# Patient Record
Sex: Female | Born: 1990 | Race: Black or African American | Hispanic: No | Marital: Single | Smoking: Never smoker
Health system: Southern US, Community
[De-identification: ages and names within clinical notes are randomized; demographics above are authoritative.]

## PROBLEM LIST (undated history)

## (undated) ENCOUNTER — Inpatient Hospital Stay (HOSPITAL_COMMUNITY): Payer: Self-pay

## (undated) DIAGNOSIS — Z789 Other specified health status: Secondary | ICD-10-CM

## (undated) HISTORY — PX: NO PAST SURGERIES: SHX2092

---
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
## 2014-04-17 ENCOUNTER — Emergency Department (HOSPITAL_COMMUNITY)
Admission: EM | Admit: 2014-04-17 | Discharge: 2014-04-17 | Disposition: A | Discharge status: Home / Self Care | Payer: Managed Care, Other (non HMO) | Attending: Emergency Medicine | Admitting: Emergency Medicine

## 2014-04-17 ENCOUNTER — Emergency Department (HOSPITAL_COMMUNITY): Payer: Managed Care, Other (non HMO)

## 2014-04-17 ENCOUNTER — Encounter (HOSPITAL_COMMUNITY): Payer: Self-pay | Admitting: Emergency Medicine

## 2014-04-17 DIAGNOSIS — Y9389 Activity, other specified: Secondary | ICD-10-CM | POA: Insufficient documentation

## 2014-04-17 DIAGNOSIS — Z3202 Encounter for pregnancy test, result negative: Secondary | ICD-10-CM | POA: Diagnosis not present

## 2014-04-17 DIAGNOSIS — S79912A Unspecified injury of left hip, initial encounter: Secondary | ICD-10-CM | POA: Diagnosis present

## 2014-04-17 DIAGNOSIS — Y92410 Unspecified street and highway as the place of occurrence of the external cause: Secondary | ICD-10-CM | POA: Diagnosis not present

## 2014-04-17 DIAGNOSIS — S70212A Abrasion, left hip, initial encounter: Secondary | ICD-10-CM | POA: Insufficient documentation

## 2014-04-17 LAB — COMPREHENSIVE METABOLIC PANEL
ALK PHOS: 34 U/L — AB (ref 39–117)
ALT: 10 U/L (ref 0–35)
AST: 18 U/L (ref 0–37)
Albumin: 4 g/dL (ref 3.5–5.2)
Anion gap: 10 (ref 5–15)
BILIRUBIN TOTAL: 0.6 mg/dL (ref 0.3–1.2)
BUN: 11 mg/dL (ref 6–23)
CALCIUM: 9.1 mg/dL (ref 8.4–10.5)
CHLORIDE: 105 meq/L (ref 96–112)
CO2: 25 meq/L (ref 19–32)
Creatinine, Ser: 0.79 mg/dL (ref 0.50–1.10)
Glucose, Bld: 85 mg/dL (ref 70–99)
Potassium: 3.4 mEq/L — ABNORMAL LOW (ref 3.7–5.3)
Sodium: 140 mEq/L (ref 137–147)
TOTAL PROTEIN: 7.5 g/dL (ref 6.0–8.3)

## 2014-04-17 LAB — CBC WITH DIFFERENTIAL/PLATELET
BASOS ABS: 0 10*3/uL (ref 0.0–0.1)
BASOS PCT: 1 % (ref 0–1)
Eosinophils Absolute: 0.1 10*3/uL (ref 0.0–0.7)
Eosinophils Relative: 3 % (ref 0–5)
HCT: 33.7 % — ABNORMAL LOW (ref 36.0–46.0)
Hemoglobin: 10.6 g/dL — ABNORMAL LOW (ref 12.0–15.0)
LYMPHS ABS: 1.2 10*3/uL (ref 0.7–4.0)
LYMPHS PCT: 27 % (ref 12–46)
MCH: 25.4 pg — AB (ref 26.0–34.0)
MCHC: 31.5 g/dL (ref 30.0–36.0)
MCV: 80.6 fL (ref 78.0–100.0)
MONO ABS: 0.6 10*3/uL (ref 0.1–1.0)
MONOS PCT: 13 % — AB (ref 3–12)
Neutro Abs: 2.5 10*3/uL (ref 1.7–7.7)
Neutrophils Relative %: 56 % (ref 43–77)
Platelets: 305 10*3/uL (ref 150–400)
RBC: 4.18 MIL/uL (ref 3.87–5.11)
RDW: 16.5 % — ABNORMAL HIGH (ref 11.5–15.5)
WBC: 4.4 10*3/uL (ref 4.0–10.5)

## 2014-04-17 LAB — PREGNANCY, URINE: Preg Test, Ur: NEGATIVE

## 2014-04-17 LAB — URINE MICROSCOPIC-ADD ON

## 2014-04-17 LAB — URINALYSIS, ROUTINE W REFLEX MICROSCOPIC
Bilirubin Urine: NEGATIVE
Glucose, UA: NEGATIVE mg/dL
Hgb urine dipstick: NEGATIVE
KETONES UR: 15 mg/dL — AB
NITRITE: NEGATIVE
PROTEIN: NEGATIVE mg/dL
Specific Gravity, Urine: 1.026 (ref 1.005–1.030)
UROBILINOGEN UA: 1 mg/dL (ref 0.0–1.0)
pH: 8 (ref 5.0–8.0)

## 2014-04-17 MED ORDER — IBUPROFEN 400 MG PO TABS
400.0000 mg | ORAL_TABLET | Freq: Once | ORAL | Status: AC
  Administered 2014-04-17: 400 mg via ORAL
  Filled 2014-04-17: qty 1

## 2014-04-17 MED ORDER — IBUPROFEN 400 MG PO TABS: 400.0000 mg | ORAL_TABLET | Freq: Four times a day (QID) | ORAL | Status: DC | PRN

## 2014-04-17 MED ORDER — ACETAMINOPHEN 325 MG PO TABS
325.0000 mg | ORAL_TABLET | Freq: Once | ORAL | Status: AC
  Administered 2014-04-17: 325 mg via ORAL
  Filled 2014-04-17: qty 1

## 2014-04-17 MED ORDER — GUAIFENESIN 100 MG/5ML PO SYRP
200.0000 mg | ORAL_SOLUTION | Freq: Once | ORAL | Status: DC
  Filled 2014-04-17: qty 10
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
## 2014-04-17 NOTE — ED Notes (Signed)
Back to room/stretcher, no changes.

## 2014-04-17 NOTE — ED Notes (Signed)
No changes, steady gait[facility] sent[facility]

## 2014-04-17 NOTE — ED Notes (Addendum)
Pt remains in CT

## 2014-04-17 NOTE — ED Notes (Signed)
Patient transported to X-ray 

## 2014-04-17 NOTE — ED Notes (Signed)
Pt talking clearly to mom, texting and taking selfie photos with her cell phone.

## 2014-04-17 NOTE — ED Notes (Addendum)
Pt alert, NAD, calm, interactive, resps e/u, speaking in clear complete sentences, VSS, up to b./r, steady gait.
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
## 2014-04-17 NOTE — ED Notes (Signed)
VS reviewed. Pt not in room.

## 2014-04-17 NOTE — ED Notes (Signed)
Mom at beside. Mom given emotional support.

## 2014-04-17 NOTE — Discharge Instructions (Signed)

## 2014-04-17 NOTE — ED Notes (Addendum)
Back from xray. Pt using cell phone & speaking with registration.  States, "can't give urine sample b/c I just used b/r in radiology", mother at [facility]. Transporter here to take pt to CT. Pt alert, soft spoken, needing to repeat self to be understood, NAD, calm, interactive, resps e/u, no dyspnea noted. PBT contacted about blood draw.

## 2014-04-17 NOTE — ED Notes (Signed)
Out with mother, steady gait.
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
## 2014-06-09 ENCOUNTER — Emergency Department (HOSPITAL_COMMUNITY)
Admission: EM | Admit: 2014-06-09 | Discharge: 2014-06-09 | Disposition: A | Discharge status: Home / Self Care | Payer: Managed Care, Other (non HMO) | Attending: Emergency Medicine | Admitting: Emergency Medicine

## 2014-06-09 ENCOUNTER — Encounter (HOSPITAL_COMMUNITY): Payer: Self-pay | Admitting: Emergency Medicine

## 2014-06-09 ENCOUNTER — Emergency Department (HOSPITAL_COMMUNITY): Payer: Managed Care, Other (non HMO)

## 2014-06-09 DIAGNOSIS — R630 Anorexia: Secondary | ICD-10-CM | POA: Insufficient documentation

## 2014-06-09 DIAGNOSIS — R0982 Postnasal drip: Secondary | ICD-10-CM | POA: Insufficient documentation

## 2014-06-09 DIAGNOSIS — R109 Unspecified abdominal pain: Secondary | ICD-10-CM | POA: Insufficient documentation

## 2014-06-09 DIAGNOSIS — J069 Acute upper respiratory infection, unspecified: Secondary | ICD-10-CM | POA: Diagnosis not present

## 2014-06-09 DIAGNOSIS — R52 Pain, unspecified: Secondary | ICD-10-CM | POA: Diagnosis present

## 2014-06-09 MED ORDER — IBUPROFEN 600 MG PO TABS: 600.0000 mg | ORAL_TABLET | Freq: Four times a day (QID) | ORAL | Status: DC | PRN

## 2014-06-09 MED ORDER — BENZONATATE 100 MG PO CAPS: 200.0000 mg | ORAL_CAPSULE | Freq: Two times a day (BID) | ORAL | Status: DC | PRN

## 2014-06-09 MED ORDER — BENZONATATE 100 MG PO CAPS
100.0000 mg | ORAL_CAPSULE | Freq: Once | ORAL | Status: AC
  Administered 2014-06-09: 100 mg via ORAL
  Filled 2014-06-09: qty 1

## 2014-06-09 MED ORDER — IBUPROFEN 200 MG PO TABS
600.0000 mg | ORAL_TABLET | Freq: Once | ORAL | Status: AC
  Administered 2014-06-09: 600 mg via ORAL
  Filled 2014-06-09: qty 3

## 2014-06-09 NOTE — ED Provider Notes (Signed)
CSN: [id number]     Arrival date & time 06/09/14  0407 History   First MD Initiated Contact with Patient 06/09/14 [id number]     Chief Complaint  Patient presents with  . Abdominal Cramping  . Generalized Body Aches  . URI   Becky Strickland is a 23 y.o. female who presents to the emergency department complaining of cough, sore throat, body aches and sneezing for one week. Patient reports her symptoms started with a sore throat and a nonproductive cough. She reports her cough is now slightly productive of green sputum. She reports her head throbs and when she coughs. She reports she has abdominal cramping only when she coughs. She reports associated sneezing, postnasal drip and chills. She denies fevers, nausea, vomiting, diarrhea, hematemesis, chest pain, shortness of breath, wheezing. The patient denies history of pneumonia or asthma. The patient tookTylenol 2 days ago with mild relief. She has attempted no treatment today.  (Consider location/radiation/quality/duration/timing/severity/associated sxs/prior Treatment) HPI  History reviewed. No pertinent past medical history. History reviewed. No pertinent past surgical history. No family history on file. History  Substance Use Topics  . Smoking status: Never Smoker   . Smokeless tobacco: Not on file  . Alcohol Use: No   OB History    No data available     Review of Systems  Constitutional: Positive for chills and appetite change. Negative for fever and activity change.  HENT: Positive for congestion, postnasal drip, rhinorrhea, sneezing and sore throat. Negative for ear discharge, ear pain, facial swelling, hearing loss, mouth sores, nosebleeds, trouble swallowing and voice change.   Eyes: Negative for pain, redness, itching and visual disturbance.  Respiratory: Positive for cough. Negative for chest tightness, shortness of breath and wheezing.   Cardiovascular: Negative for chest pain and palpitations.  Gastrointestinal: Positive for  abdominal pain. Negative for nausea, vomiting, diarrhea and blood in stool.  Genitourinary: Negative for dysuria, frequency, hematuria, vaginal bleeding, difficulty urinating and pelvic pain.  Musculoskeletal: Negative for back pain, neck pain and neck stiffness.  Skin: Negative for pallor, rash and wound.  Neurological: Negative for dizziness, syncope, weakness, light-headedness and headaches.  All other systems reviewed and are negative.     Allergies  Review of patient's allergies indicates no known allergies.  Home Medications   Prior to Admission medications   Medication Sig Start Date End Date Taking? Authorizing Provider  DM-Phenylephrine-Acetaminophen (TYLENOL COLD MULTI-SYMPTOM) 10-5-325 MG/15ML LIQD Take 15 mLs by mouth 2 (two) times daily as needed.    Yes Historical Provider, MD  guaifenesin (HUMIBID E) 400 MG TABS tablet Take 400 mg by mouth every 4 (four) hours as needed. For cough   Yes Historical Provider, MD  benzonatate (TESSALON) 100 MG capsule Take 2 capsules (200 mg total) by mouth 2 (two) times daily as needed for cough. 06/09/14   Einar GipWilliam Duncan Candra Wegner, PA-C  ibuprofen (ADVIL,MOTRIN) 600 MG tablet Take 1 tablet (600 mg total) by mouth every 6 (six) hours as needed. 06/09/14   Einar GipWilliam Duncan Koree Staheli, PA-C   BP 107/76 mmHg  Pulse 85  Temp(Src) 97.8 F (36.6 C) (Oral)  Resp 18  Ht 5\' 1"  (1.549 m)  Wt 107 lb (48.535 kg)  BMI 20.23 kg/m2  SpO2 100%  LMP 06/05/2014 Physical Exam  Constitutional: She appears well-developed and well-nourished. No distress.  Patient is non-toxic appearing.   HENT:  Head: Normocephalic and atraumatic.  Right Ear: External ear normal.  Left Ear: External ear normal.  Nose: Nose normal.  Mouth/Throat: Oropharynx is  clear and moist. No oropharyngeal exudate.  Bilateral tympanic membranes are pearly-gray without erythema or loss of landmarks. No tonsillar hypertrophy or tonsillar exudate. No frontal, ethmoid or maxillary sinus  tenderness.  Eyes: Conjunctivae are normal. Pupils are equal, round, and reactive to light. Right eye exhibits no discharge. Left eye exhibits no discharge.  Neck: Normal range of motion. Neck supple. No tracheal deviation present.  Cardiovascular: Normal rate, regular rhythm, normal heart sounds and intact distal pulses.  Exam reveals no gallop and no friction rub.   No murmur heard. Pulmonary/Chest: Effort normal and breath sounds normal. No respiratory distress. She has no wheezes. She has no rales.  Abdominal: Soft. Bowel sounds are normal. She exhibits no distension and no mass. There is no tenderness. There is no rebound and no guarding.  Patient's abdomen is soft. Patient's abdomen is nontender. Negative psoas and obturator sign. No rebound tenderness. Negative Murphy sign. Negative Rovsing sign.  Musculoskeletal: She exhibits no edema.  Lymphadenopathy:    She has no cervical adenopathy.  Neurological: She is alert. Coordination normal.  Skin: Skin is warm and dry. No rash noted. She is not diaphoretic. No erythema. No pallor.  Psychiatric: She has a normal mood and affect. Her behavior is normal.  Nursing note and vitals reviewed.   ED Course  Procedures (including critical care time) Labs Review Labs Reviewed - No data to display  Imaging Review Dg Chest 2 View  06/09/2014   CLINICAL DATA:  Acute onset of cough, fever, body aches and pains for 1 week. Shortness of breath. Initial encounter.  EXAM: CHEST  2 VIEW  COMPARISON:  Chest radiograph performed 04/17/2014  FINDINGS: The lungs are well-aerated and clear. There is no evidence of focal opacification, pleural effusion or pneumothorax.  The heart is normal in size; the mediastinal contour is within normal limits. No acute osseous abnormalities are seen.  IMPRESSION: No acute cardiopulmonary process seen.   Electronically Signed   By: Roanna Raider M.D.   On: 06/09/2014 06:48     EKG Interpretation None      Filed Vitals:    06/09/14 0432  BP: 107/76  Pulse: 85  Temp: 97.8 F (36.6 C)  TempSrc: Oral  Resp: 18  Height: 5\' 1"  (1.549 m)  Weight: 107 lb (48.535 kg)  SpO2: 100%     MDM   Meds given in ED:  Medications  ibuprofen (ADVIL,MOTRIN) tablet 600 mg (600 mg Oral Given 06/09/14 0617)  benzonatate (TESSALON) capsule 100 mg (100 mg Oral Given 06/09/14 0617)    New Prescriptions   BENZONATATE (TESSALON) 100 MG CAPSULE    Take 2 capsules (200 mg total) by mouth 2 (two) times daily as needed for cough.   IBUPROFEN (ADVIL,MOTRIN) 600 MG TABLET    Take 1 tablet (600 mg total) by mouth every 6 (six) hours as needed.    Final diagnoses:  Upper respiratory infection, viral   Becky Strickland is a 23 y.o. female who presents to the emergency department complaining of cough, sore throat, body aches and sneezing for one week. Patient is afebrile and nontoxic-appearing. Patient's lungs are clear. The patient's abdomen is soft and nontender to palpation. The patient's chest x-ray is unremarkable. The patient reports improvement with Motrin and Tessalon Perles given in ED. Will discharge the patient with Tessalon Perles and Motrin. Education provided on symptomatic treatment of a viral upper respiratory infection. Advised patient she needs to follow-up with primary care provider next week. Patient is provided resource list  of primary care providers in the area. I advised patient to return to the emergency department with new or worsening symptoms, difficulty breathing or new concerns. The patient verbalized understanding and agreement with plan.  The patient was discussed with Dr. Eber HongBrian Miller who agrees with assessment and plan.     Lawana ChambersWilliam Duncan Desmon Hitchner, PA-C 06/09/14 [id number]  Vida RollerBrian D Miller, MD 06/09/14 [phone]

## 2014-06-09 NOTE — ED Notes (Signed)
Pt c/o sore throat, URI s/s, HA, body aches, and abdominal "tighting" nausea with coughing spells.

## 2014-06-09 NOTE — ED Provider Notes (Signed)
23 year old female, history of cough, runny nose, diffuse body aches and excessive sneezing. This happened with change in the weather. On exam she has clear lung sounds, no tachycardia, no peripheral edema, mild rhinorrhea in the bilateral nares, no signs of acute pharyngitis or exudative tonsillitis. X-ray to rule out pneumonia, cough medicine, ibuprofen, anticipate discharge. The patient is very well-appearing.  Medical screening examination/treatment/procedure(s) were conducted as a shared visit with non-physician practitioner(s) and myself.  I personally evaluated the patient during the encounter.  Clinical Impression:   Final diagnoses:  Upper respiratory infection, viral         Vida RollerBrian D Sonna Lipsky, MD 06/09/14 2340

## 2014-06-09 NOTE — Discharge Instructions (Signed)
Cough, Adult  A cough is a reflex that helps clear your throat and airways. It can help heal the body or may be a reaction to an irritated airway. A cough may only last 2 or 3 weeks (acute) or may last more than 8 weeks (chronic).  CAUSES Acute cough:  Viral or bacterial infections. Chronic cough:  Infections.  Allergies.  Asthma.  Post-nasal drip.  Smoking.  Heartburn or acid reflux.  Some medicines.  Chronic lung problems (COPD).  Cancer. SYMPTOMS   Cough.  Fever.  Chest pain.  Increased breathing rate.  High-pitched whistling sound when breathing (wheezing).  Colored mucus that you cough up (sputum). TREATMENT   A bacterial cough may be treated with antibiotic medicine.  A viral cough must run its course and will not respond to antibiotics.  Your caregiver may recommend other treatments if you have a chronic cough. HOME CARE INSTRUCTIONS   Only take over-the-counter or prescription medicines for pain, discomfort, or fever as directed by your caregiver. Use cough suppressants only as directed by your caregiver.  Use a cold steam vaporizer or humidifier in your bedroom or home to help loosen secretions.  Sleep in a semi-upright position if your cough is worse at night.  Rest as needed.  Stop smoking if you smoke. SEEK IMMEDIATE MEDICAL CARE IF:   You have pus in your sputum.  Your cough starts to worsen.  You cannot control your cough with suppressants and are losing sleep.  You begin coughing up blood.  You have difficulty breathing.  You develop pain which is getting worse or is uncontrolled with medicine.  You have a fever. MAKE SURE YOU:   Understand these instructions.  Will watch your condition.  Will get help right away if you are not doing well or get worse. Document Released: 12/26/2010 Document Revised: 09/21/2011 Document Reviewed: 12/26/2010 [facility] Patient Information 2015 [place], [place]. This information is not intended  to replace advice given to you by your health care provider. Make sure you discuss any questions you have with your health care provider.  Upper Respiratory Infection, Adult An upper respiratory infection (URI) is also sometimes known as the common cold. The upper respiratory tract includes the nose, sinuses, throat, trachea, and bronchi. Bronchi are the airways leading to the lungs. Most people improve within 1 week, but symptoms can last up to 2 weeks. A residual cough may last even longer.  CAUSES Many different viruses can infect the tissues lining the upper respiratory tract. The tissues become irritated and inflamed and often become very moist. Mucus production is also common. A cold is contagious. You can easily spread the virus to others by oral contact. This includes kissing, sharing a glass, coughing, or sneezing. Touching your mouth or nose and then touching a surface, which is then touched by another person, can also spread the virus. SYMPTOMS  Symptoms typically develop 1 to 3 days after you come in contact with a cold virus. Symptoms vary from person to person. They may include:  Runny nose.  Sneezing.  Nasal congestion.  Sinus irritation.  Sore throat.  Loss of voice (laryngitis).  Cough.  Fatigue.  Muscle aches.  Loss of appetite.  Headache.  Low-grade fever. DIAGNOSIS  You might diagnose your own cold based on familiar symptoms, since most people get a cold 2 to 3 times a year. Your caregiver can confirm this based on your exam. Most importantly, your caregiver can check that your symptoms are not due to another disease  such as strep throat, sinusitis, pneumonia, asthma, or epiglottitis. Blood tests, throat tests, and X-rays are not necessary to diagnose a common cold, but they may sometimes be helpful in excluding other more serious diseases. Your caregiver will decide if any further tests are required. RISKS AND COMPLICATIONS  You may be at risk for a more severe  case of the common cold if you smoke cigarettes, have chronic heart disease (such as heart failure) or lung disease (such as asthma), or if you have a weakened immune system. The very young and very old are also at risk for more serious infections. Bacterial sinusitis, middle ear infections, and bacterial pneumonia can complicate the common cold. The common cold can worsen asthma and chronic obstructive pulmonary disease (COPD). Sometimes, these complications can require emergency medical care and may be life-threatening. PREVENTION  The best way to protect against getting a cold is to practice good hygiene. Avoid oral or hand contact with people with cold symptoms. Wash your hands often if contact occurs. There is no clear evidence that vitamin C, vitamin E, echinacea, or exercise reduces the chance of developing a cold. However, it is always recommended to get plenty of rest and practice good nutrition. TREATMENT  Treatment is directed at relieving symptoms. There is no cure. Antibiotics are not effective, because the infection is caused by a virus, not by bacteria. Treatment may include:  Increased fluid intake. Sports drinks offer valuable electrolytes, sugars, and fluids.  Breathing heated mist or steam (vaporizer or shower).  Eating chicken soup or other clear broths, and maintaining good nutrition.  Getting plenty of rest.  Using gargles or lozenges for comfort.  Controlling fevers with ibuprofen or acetaminophen as directed by your caregiver.  Increasing usage of your inhaler if you have asthma. Zinc gel and zinc lozenges, taken in the first 24 hours of the common cold, can shorten the duration and lessen the severity of symptoms. Pain medicines may help with fever, muscle aches, and throat pain. A variety of non-prescription medicines are available to treat congestion and runny nose. Your caregiver can make recommendations and may suggest nasal or lung inhalers for other symptoms.  HOME  CARE INSTRUCTIONS   Only take over-the-counter or prescription medicines for pain, discomfort, or fever as directed by your caregiver.  Use a warm mist humidifier or inhale steam from a shower to increase air moisture. This may keep secretions moist and make it easier to breathe.  Drink enough water and fluids to keep your urine clear or pale yellow.  Rest as needed.  Return to work when your temperature has returned to normal or as your caregiver advises. You may need to stay home longer to avoid infecting others. You can also use a face mask and careful hand washing to prevent spread of the virus. SEEK MEDICAL CARE IF:   After the first few days, you feel you are getting worse rather than better.  You need your caregiver's advice about medicines to control symptoms.  You develop chills, worsening shortness of breath, or brown or red sputum. These may be signs of pneumonia.  You develop yellow or brown nasal discharge or pain in the face, especially when you bend forward. These may be signs of sinusitis.  You develop a fever, swollen neck glands, pain with swallowing, or white areas in the back of your throat. These may be signs of strep throat. SEEK IMMEDIATE MEDICAL CARE IF:   You have a fever.  You develop severe or persistent headache,  ear pain, sinus pain, or chest pain.  You develop wheezing, a prolonged cough, cough up blood, or have a change in your usual mucus (if you have chronic lung disease).  You develop sore muscles or a stiff neck. Document Released: 12/23/2000 Document Revised: 09/21/2011 Document Reviewed: 10/04/2013 [facility] Patient Information 2015 [place]. This information is not intended to replace advice given to you by your health care provider. Make sure you discuss any questions you have with your health care provider.  Emergency Department Resource Guide 1) Find a Doctor and Pay Out of Pocket Although you won't have to find out who is covered by  your insurance plan, it is a good idea to ask around and get recommendations. You will then need to call the office and see if the doctor you have chosen will accept you as a new patient and what types of options they offer for patients who are self-pay. Some doctors offer discounts or will set up payment plans for their patients who do not have insurance, but you will need to ask so you aren't surprised when you get to your appointment.  2) Contact Your Local Health Department Not all health departments have doctors that can see patients for sick visits, but many do, so it is worth a call to see if yours does. If you don't know where your local health department is, you can check in your phone book. The CDC also has a tool to help you locate your state's health department, and many state websites also have listings of all of their local health departments.  3) Find a Walk-in Clinic If your illness is not likely to be very severe or complicated, you may want to try a walk in clinic. These are popping up all over the country in pharmacies, drugstores, and shopping centers. They're usually staffed by nurse practitioners or physician assistants that have been trained to treat common illnesses and complaints. They're usually fairly quick and inexpensive. However, if you have serious medical issues or chronic medical problems, these are probably not your best option.  No Primary Care Doctor: - Call Health Connect at  [phone] - they can help you locate a primary care doctor that  accepts your insurance, provides certain services, etc. - Physician Referral Service- [phone]  Chronic Pain Problems: Organization         Address  Phone   Notes  [facility]  [phone]) [phone] Patients need to be referred by their primary care doctor.   Medication Assistance: Organization         Address  Phone   Notes  [facility] Medication [facility] [address] [place], [place] [address] [phone] --Must be a resident of [place] -- Must have NO insurance coverage whatsoever (no Medicaid/ Medicare, etc.) -- The pt. MUST have a primary care doctor that directs their care regularly and follows them in the community   MedAssist  [phone]   [employer]  [phone]    Agencies that provide inexpensive medical care: Organization         Address  Phone   Notes  [facility]  [phone]   [facility]    [phone]) [phone]   [facility] [address] [place], [place] [address] [phone]) [phone]   [facility] [address], [place] [phone]   [facility]    [phone]  [facility]    [phone]   [facility]  [address], [place] Phone:  [phone], Fax:  [phone] Hours of Operation:  9 am - 6 pm, M-F.  Also accepts Medicaid/Medicare and self-pay.  [facility] for Jane Lew Gail, [address], [place] Phone: [phone], Fax: [phone]. Hours of Operation:  8:30 am - 5:30 pm, M-F.  Also accepts Medicaid and self-pay.  [facility] [address], [place] Phone: [phone]   [place], [place], [place] [phone], Ext. [phone] Mondays & Thursdays: 7-9 AM.  First 15 patients are seen on a first come, first serve basis.    [place]uliar Providers:  Organization         Address  Phone   Notes  [facility] [address], [place] [phone] Also accepts self-pay patients.  [facility] [address], [place]  [phone]   [place], [address], [place] [phone]   [facility] [address] [place] [phone]   Lucianne Lei [address], [place]   [phone] Only accepts [place] Access [place] patients after they have their name applied to their card.   Self-Pay (no insurance) in [facility]:  Organization         Address  Phone   Notes  Sickle Cell Patients, [facility] Internal Medicine [place] [phone]   [facility] Urgent Care [place] [phone]   [facility] [place]  [place], [place], [place] [phone]   [facility]/Dr. Osei-Bonsu  [address], [place] or [address], [place] [phone] Phone number for both [place] and [place] locations is the same.  Urgent Medical and [facility] [address] [place] [phone]   [facility] [address] [place] or [address] [phone]   [facility] [address], [place] [phone], phone; [phone], fax Sees patients 1st and 3rd Saturday of every month.  Must not qualify for public or private insurance (i.e. Medicaid, Medicare, [place] Health Choice, Veterans' Benefits)  Household income should be no more than 200% of the poverty level The clinic cannot treat you if you are pregnant or think you are pregnant  Sexually transmitted diseases are not treated at the clinic.    Dental Care: Organization         Address  Phone  Notes  [facility] Department of [facility] [place] [phone] Accepts children up to age 62 who are enrolled in [place] or [place]; pregnant women with a Medicaid card; and children who have applied for Medicaid or [place] Health Choice, but were declined, whose parents can pay a reduced fee at time of service.  [facility] of [place] [facility]  [address], [place] [phone] Accepts children up to age 17 who are enrolled  in [place] or [place]; pregnant women with a Medicaid card; and children who have applied for Medicaid or [place] Health Choice, but were declined, whose parents can pay a reduced fee at time  of service.  [place] Adult [facility] Access PROGRAM  [place] [phone] Patients are seen by appointment only. Walk-ins are not accepted. [place] will see patients 36 years of age and older. Monday - Tuesday (8am-5pm) Most Wednesdays (8:30-5pm) $30 per visit, cash only  [facility] Adult Dental Access PROGRAM  [address], [facility] [phone] Patients are seen by appointment only. Walk-ins are not accepted. [place] will see patients 57 years of age and older. One Wednesday Evening (Monthly: Volunteer Based).  $30 per visit, cash only  [place]  [phone] for adults; Children under age 79, call Graduate Pediatric Dentistry at [phone]. Children aged 43-14, please call [phone] to request a pediatric application.  Dental services are provided in all areas of dental care including fillings, crowns and bridges, complete and partial dentures, implants, gum treatment, root canals, and extractions. Preventive care is also provided. Treatment is provided to both adults and children. Patients are selected via a lottery and there is often a waiting list.   [facility] [address] [place]  [phone] [email or web address]   [facility] [address] [place], [place] [phone], Ext. [phone] Second and Fourth Thursday of each month, opens at 6:30 AM; Clinic ends at 9 AM.  Patients are seen on a first-come first-served basis, and a limited number are seen during each clinic.   [facility]  [address] Hillard Danker [place], [place] [phone]   Eligibility Requirements You must have lived in [place], [place], or [place] counties for at least the last three months.   You cannot be  eligible for state or federal sponsored [employer], including [employer], [place], or [employer].   You generally cannot be eligible for healthcare insurance through your employer.    How to apply: Eligibility screenings are held every Tuesday and Wednesday afternoon from 1:00 pm until 4:00 pm. You do not need an appointment for the interview!  [facility] [address], [place], [place]   [place]  [place] Department  [place]  [phone]    Behavioral Health Resources in the Community: Intensive Outpatient Programs Organization         Address  Phone  Notes  [place]. [address], [place], [place] [phone]   [facility] Outpatient [address] [place], [place]   ADS: [facility] Drug [facility] [address], [place], [place]   [place] [address],  [place], [place] or [phone]   Substance Abuse Resources Organization         Address  Phone  Notes  Alcohol and Drug Services  [phone]   [place]  [phone]   [facility] [place]   Chinita Pester  [phone]   Residential & Outpatient Substance Abuse Program  [phone]   Psychological Services Organization         Address  Phone  Notes  [facility]  [place]  [phone]   [place] [address] [place] [phone] or [phone]    Mobile Crisis Teams Organization         Address  Phone  Notes  [facility], Mobile Crisis Care Unit  [phone]   Assertive Psychotherapeutic Services  [place], [place]   [facility] [address] [place] [phone]    Self-Help/Support Groups Organization          Address  Phone             Notes  [place] of [place] - variety of support groups  [place] Call for more information  Narcotics Anonymous (NA), [facility] [address], [employer] [place]  2 meetings at this location   Special educational needs teacher         Address  Phone  Notes  [facility]AP Residential Treatment [place],    [place]  [phone]   [facility]  [address] [place] [phone], [place], [place]   [place], [place] [phone] Admissions: 8am-3pm M-F  Incentives Substance [place] [address],    [place], [place] [phone]   [facility] [address] [place], [place], [place]   [facility] [address],  [place], [place]   [facility] - Intensive Outpatient [address] Kristeen [address], [place], [place]   [facility] ([place].,  [place], [place] [phone] or [phone]   [facility] (RTS) [address], [place], [place] Accepts Medicaid  [facility] [place] [address]  [place] [phone] Substance Abuse/Addiction Treatment   [facility] Organization         Address  Phone  Notes  [facility]  [phone]   Domenic Schwab, PhD [address] Arlis Porta [place], [place]   [phone] or [phone]   [place], [place] [phone]   [facility] [address], [place], [place] [phone] Insurance/Medicaid/sponsorship through [facility] and Families [address], [address] [place]                                    [place], [place] [phone] [place] [address], [place] [phone]    Dr. Adele Schilder  [phone]   [facility] Dept. 1) [address] [place] 2) [place] 3)  [place], [place] [phone]  [phone]   [place] [phone] or [phone] (After Hours)

## 2014-08-27 ENCOUNTER — Inpatient Hospital Stay (HOSPITAL_COMMUNITY)
Admission: AD | Admit: 2014-08-27 | Discharge: 2014-08-27 | Disposition: A | Discharge status: Home / Self Care | Payer: Managed Care, Other (non HMO) | Source: Ambulatory Visit | Attending: Obstetrics & Gynecology | Admitting: Obstetrics & Gynecology

## 2014-08-27 ENCOUNTER — Emergency Department (HOSPITAL_COMMUNITY): Admission: EM | Admit: 2014-08-27 | Discharge: 2014-08-27 | Discharge status: Absent without leave | Payer: Self-pay

## 2014-08-27 ENCOUNTER — Encounter (HOSPITAL_COMMUNITY): Payer: Self-pay

## 2014-08-27 DIAGNOSIS — Z3201 Encounter for pregnancy test, result positive: Secondary | ICD-10-CM | POA: Insufficient documentation

## 2014-08-27 DIAGNOSIS — Z3200 Encounter for pregnancy test, result unknown: Secondary | ICD-10-CM | POA: Diagnosis present

## 2014-08-27 LAB — POCT PREGNANCY, URINE: PREG TEST UR: POSITIVE — AB

## 2014-08-27 NOTE — MAU Provider Note (Signed)
Subjective:  Becky Strickland is a 24 y.o. female G1P0 at G1P0 who presents to MAU for a pregnancy test. She denies pain or bleeding.    Objective:  Filed Vitals:   08/27/14 1453  BP: 108/66  Pulse: 82  Temp: 98.4 F (36.9 C)  Resp: 16   GENERAL: Well-developed, well-nourished female in no acute distress.  HEENT: Normocephalic, atraumatic.   LUNGS: Effort normal SKIN: Warm, dry and without erythema PSYCH: Normal mood and affect  Results for orders placed or performed during the hospital encounter of 08/27/14 (from the past 48 hour(s))  Pregnancy, urine POC     Status: Abnormal   Collection Time: 08/27/14  2:49 PM  Result Value Ref Range   Preg Test, Ur POSITIVE (A) NEGATIVE    Comment:        THE SENSITIVITY OF THIS METHODOLOGY IS >24 mIU/mL      Assessment:  Encounter for pregnancy test, with results positive     Plan:  Discharge home in stable condition Start prenatal care ASAP Return to MAU with any pain or bleeding  Safe medication list provided Pregnancy verification letter given.    Iona HansenJennifer Irene Rasch, NP 08/27/2014 3:06 PM

## 2014-08-27 NOTE — MAU Note (Addendum)
Pt states here for pregnancy test only, and to see how far along she is. LMP-07/22/2014. Denies bleeding or abnormal vaginal discharge. No pain.

## 2014-09-24 ENCOUNTER — Inpatient Hospital Stay (HOSPITAL_COMMUNITY)
Admission: AD | Admit: 2014-09-24 | Discharge: 2014-09-24 | Disposition: A | Discharge status: Home / Self Care | Payer: Managed Care, Other (non HMO) | Source: Ambulatory Visit | Attending: Obstetrics and Gynecology | Admitting: Obstetrics and Gynecology

## 2014-09-24 ENCOUNTER — Encounter (HOSPITAL_COMMUNITY): Payer: Self-pay | Admitting: *Deleted

## 2014-09-24 DIAGNOSIS — R109 Unspecified abdominal pain: Secondary | ICD-10-CM | POA: Insufficient documentation

## 2014-09-24 DIAGNOSIS — K219 Gastro-esophageal reflux disease without esophagitis: Secondary | ICD-10-CM | POA: Insufficient documentation

## 2014-09-24 DIAGNOSIS — O99611 Diseases of the digestive system complicating pregnancy, first trimester: Secondary | ICD-10-CM | POA: Insufficient documentation

## 2014-09-24 DIAGNOSIS — Z3A09 9 weeks gestation of pregnancy: Secondary | ICD-10-CM | POA: Diagnosis not present

## 2014-09-24 HISTORY — DX: Other specified health status: Z78.9

## 2014-09-24 LAB — URINALYSIS, ROUTINE W REFLEX MICROSCOPIC
BILIRUBIN URINE: NEGATIVE
GLUCOSE, UA: NEGATIVE mg/dL
HGB URINE DIPSTICK: NEGATIVE
KETONES UR: 15 mg/dL — AB
Leukocytes, UA: NEGATIVE
NITRITE: NEGATIVE
Protein, ur: NEGATIVE mg/dL
Specific Gravity, Urine: 1.025 (ref 1.005–1.030)
UROBILINOGEN UA: 0.2 mg/dL (ref 0.0–1.0)
pH: 6 (ref 5.0–8.0)

## 2014-09-24 MED ORDER — PRENATAL PLUS 27-1 MG PO TABS: 1.0000 | ORAL_TABLET | Freq: Every day | ORAL | Status: DC

## 2014-09-24 NOTE — Discharge Instructions (Signed)
Gastroesophageal Reflux Disease, Adult Gastroesophageal reflux disease (GERD) happens when acid from your stomach flows up into the esophagus. When acid comes in contact with the esophagus, the acid causes soreness (inflammation) in the esophagus. Over time, GERD may create small holes (ulcers) in the lining of the esophagus. CAUSES   Increased body weight. This puts pressure on the stomach, making acid rise from the stomach into the esophagus.  Smoking. This increases acid production in the stomach.  Drinking alcohol. This causes decreased pressure in the lower esophageal sphincter (valve or ring of muscle between the esophagus and stomach), allowing acid from the stomach into the esophagus.  Late evening meals and a full stomach. This increases pressure and acid production in the stomach.  A malformed lower esophageal sphincter. Sometimes, no cause is found. SYMPTOMS   Burning pain in the lower part of the mid-chest behind the breastbone and in the mid-stomach area. This may occur twice a week or more often.  Trouble swallowing.  Sore throat.  Dry cough.  Asthma-like symptoms including chest tightness, shortness of breath, or wheezing. DIAGNOSIS  Your caregiver may be able to diagnose GERD based on your symptoms. In some cases, X-rays and other tests may be done to check for complications or to check the condition of your stomach and esophagus. TREATMENT  Your caregiver may recommend over-the-counter or prescription medicines to help decrease acid production. Ask your caregiver before starting or adding any new medicines.  HOME CARE INSTRUCTIONS   Change the factors that you can control. Ask your caregiver for guidance concerning weight loss, quitting smoking, and alcohol consumption.  Avoid foods and drinks that make your symptoms worse, such as:  Caffeine or alcoholic drinks.  Chocolate.  Peppermint or mint flavorings.  Garlic and onions.  Spicy foods.  Citrus fruits,  such as oranges, lemons, or limes.  Tomato-based foods such as sauce, chili, salsa, and pizza.  Fried and fatty foods.  Avoid lying down for the 3 hours prior to your bedtime or prior to taking a nap.  Eat small, frequent meals instead of large meals.  Wear loose-fitting clothing. Do not wear anything tight around your waist that causes pressure on your stomach.  Raise the head of your bed 6 to 8 inches with wood blocks to help you sleep. Extra pillows will not help.  Only take over-the-counter or prescription medicines for pain, discomfort, or fever as directed by your caregiver.  Do not take aspirin, ibuprofen, or other nonsteroidal anti-inflammatory drugs (NSAIDs). SEEK IMMEDIATE MEDICAL CARE IF:   You have pain in your arms, neck, jaw, teeth, or back.  Your pain increases or changes in intensity or duration.  You develop nausea, vomiting, or sweating (diaphoresis).  You develop shortness of breath, or you faint.  Your vomit is green, yellow, black, or looks like coffee grounds or blood.  Your stool is red, bloody, or black. These symptoms could be signs of other problems, such as heart disease, gastric bleeding, or esophageal bleeding. MAKE SURE YOU:   Understand these instructions.  Will watch your condition.  Will get help right away if you are not doing well or get worse. Document Released: 04/08/2005 Document Revised: 09/21/2011 Document Reviewed: 01/16/2011 ExitCare Patient Information 2015 [employer]. This information is not intended to replace advice given to you by your health care provider. Make sure you discuss any questions you have with your health care provider.  

## 2014-09-24 NOTE — MAU Provider Note (Signed)
History     CSN: [id number]  Arrival date and time: 09/24/14 1447   First Provider Initiated Contact with Patient 09/24/14 1538      Chief Complaint  Patient presents with  . Abdominal Pain   HPI Becky Strickland 23 y.o. G1P0  presents to MAU complaining of cramping abdominal pain.  She has not yet started [facility].  She has been having cramps since before she knew she was pregnant.  Over the past week, the pain is more severe.  It is 7-9/10 and interferes with her sleep. It starts in the top of her belly but may be down closer to her belly button and occasionally lower toward pelvic bone. It can last a few minutes to all night long when she is trying to sleep.   It is always in the middle, not right or left.   It is associated with nausea.  She denies HA, weakness, fever, vaginal bleeding, vaginal discharge.   No association with eating.  Not helped by ibuprofen.   No pain in abdomen at all at present.  OB History    Gravida Para Term Preterm AB TAB SAB Ectopic Multiple Living   1               Past Medical History  Diagnosis Date  . Medical history non-contributory     Past Surgical History  Procedure Laterality Date  . No past surgeries      Family History  Problem Relation Age of Onset  . Fibroids Mother   . Fibroids Maternal Aunt   . Fibroids Maternal Grandmother     History  Substance Use Topics  . Smoking status: Never Smoker   . Smokeless tobacco: Not on file  . Alcohol Use: Yes     Comment: not while pregnant    Allergies: No Known Allergies  Prescriptions prior to admission  Medication Sig Dispense Refill Last Dose  . benzonatate (TESSALON) 100 MG capsule Take 2 capsules (200 mg total) by mouth 2 (two) times daily as needed for cough. 20 capsule 0   . DM-Phenylephrine-Acetaminophen (TYLENOL COLD MULTI-SYMPTOM) 10-5-325 MG/15ML LIQD Take 15 mLs by mouth 2 (two) times daily as needed.    06/08/2014 at Unknown time  . guaifenesin (HUMIBID E) 400 MG TABS  tablet Take 400 mg by mouth every 4 (four) hours as needed. For cough   06/08/2014 at Unknown time  . ibuprofen (ADVIL,MOTRIN) 600 MG tablet Take 1 tablet (600 mg total) by mouth every 6 (six) hours as needed. 30 tablet 0     ROS Pertinent ROS in HPI  Physical Exam   Blood pressure 99/70, pulse 77, temperature 98.9 F (37.2 C), temperature source Oral, resp. rate 16, height  (1.549 m), weight 105 lb 2 oz (47.684 kg), last menstrual period 07/22/2014.  Physical Exam  Constitutional: She is oriented to person, place, and time. She appears well-developed and well-nourished. No distress.  HENT:  Head: Normocephalic and atraumatic.  Eyes: EOM are normal.  Neck: Normal range of motion.  Cardiovascular: Normal rate, regular rhythm and normal heart sounds.   Respiratory: Effort normal and breath sounds normal. No respiratory distress.  GI: Soft. Bowel sounds are normal. She exhibits no distension. There is no tenderness. There is no rebound and no guarding.  Musculoskeletal: Normal range of motion.  Neurological: She is alert and oriented to person, place, and time.  Skin: Skin is warm and dry.  Psychiatric: She has a normal mood and affect.  MAU Course  Procedures  MDM Likely reflux as pain is epigastric.  Pregnancy is unlikely source of the patient's epigastric pain.    Report given and care turned over to Standard PacificDeirdre Poe, CNM.    Assessment and Plan  A:  1. Gastroesophageal reflux disease without esophagitis      P: Discharge with instructions on diet to alleviate sx, try Tums or OTC antacid   Medication List    STOP taking these medications        benzonatate 100 MG capsule  Commonly known as:  TESSALON     guaifenesin 400 MG Tabs tablet  Commonly known as:  HUMIBID E     ibuprofen 600 MG tablet  Commonly known as:  ADVIL,MOTRIN     TYLENOL COLD MULTI-SYMPTOM 10-5-325 MG/15ML Liqd  Generic drug:  DM-Phenylephrine-Acetaminophen      TAKE these medications         prenatal vitamin w/FE, FA 27-1 MG Tabs tablet  Take 1 tablet by mouth daily.       Follow-up Information    Follow up with Serita KyleOUSINS,SHERONETTE A, MD.   Specialty:  Obstetrics and Gynecology   Why:  Keep your scheduled prenatal appointment   Contact information:   [address] [place] [id number] [phone]     Danae Orleanseirdre C Poe, CNM 09/24/2014 4:15 PM    Teague Edwena Blowlark, Karen E 09/24/2014, 3:41 PM

## 2014-09-24 NOTE — MAU Note (Signed)
Pt states intermittent epigastric pain as well as lower abd pain that woke her up. Denies bleeding, vag d/c, or uti s/s. Has had pain since beginning of pregnancy.

## 2015-02-18 ENCOUNTER — Encounter (HOSPITAL_COMMUNITY): Payer: Self-pay

## 2015-02-18 ENCOUNTER — Inpatient Hospital Stay (HOSPITAL_COMMUNITY)
Admission: AD | Admit: 2015-02-18 | Discharge: 2015-02-18 | Disposition: A | Discharge status: Home / Self Care | Payer: Managed Care, Other (non HMO) | Source: Ambulatory Visit | Attending: Family Medicine | Admitting: Family Medicine

## 2015-02-18 DIAGNOSIS — N939 Abnormal uterine and vaginal bleeding, unspecified: Secondary | ICD-10-CM | POA: Diagnosis not present

## 2015-02-18 DIAGNOSIS — N920 Excessive and frequent menstruation with regular cycle: Secondary | ICD-10-CM | POA: Diagnosis not present

## 2015-02-18 LAB — URINALYSIS, ROUTINE W REFLEX MICROSCOPIC
Bilirubin Urine: NEGATIVE
GLUCOSE, UA: NEGATIVE mg/dL
Hgb urine dipstick: NEGATIVE
KETONES UR: NEGATIVE mg/dL
Leukocytes, UA: NEGATIVE
NITRITE: NEGATIVE
PH: 5.5 (ref 5.0–8.0)
Protein, ur: NEGATIVE mg/dL
Specific Gravity, Urine: >1.03 — ABNORMAL HIGH (ref 1.005–1.030)
UROBILINOGEN UA: 0.2 mg/dL (ref 0.0–1.0)

## 2015-02-18 LAB — WET PREP, GENITAL
CLUE CELLS WET PREP: NONE SEEN
TRICH WET PREP: NONE SEEN
WBC, Wet Prep HPF POC: NONE SEEN
YEAST WET PREP: NONE SEEN

## 2015-02-18 LAB — POCT PREGNANCY, URINE: Preg Test, Ur: NEGATIVE

## 2015-02-18 NOTE — Discharge Instructions (Signed)
Abnormal Uterine Bleeding Abnormal uterine bleeding can affect women at various stages in life, including teenagers, women in their reproductive years, pregnant women, and women who have reached menopause. Several kinds of uterine bleeding are considered abnormal, including:  Bleeding or spotting between periods.   Bleeding after sexual intercourse.   Bleeding that is heavier or more than normal.   Periods that last longer than usual.  Bleeding after menopause.  Many cases of abnormal uterine bleeding are minor and simple to treat, while others are more serious. Any type of abnormal bleeding should be evaluated by your health care provider. Treatment will depend on the cause of the bleeding. HOME CARE INSTRUCTIONS Monitor your condition for any changes. The following actions may help to alleviate any discomfort you are experiencing:  Avoid the use of tampons and douches as directed by your health care provider.  Change your pads frequently. You should get regular pelvic exams and Pap tests. Keep all follow-up appointments for diagnostic tests as directed by your health care provider.  SEEK MEDICAL CARE IF:   Your bleeding lasts more than 1 week.   You feel dizzy at times.  SEEK IMMEDIATE MEDICAL CARE IF:   You pass out.   You are changing pads every 15 to 30 minutes.   You have abdominal pain.  You have a fever.   You become sweaty or weak.   You are passing large blood clots from the vagina.   You start to feel nauseous and vomit. MAKE SURE YOU:   Understand these instructions.  Will watch your condition.  Will get help right away if you are not doing well or get worse. Document Released: 06/29/2005 Document Revised: 07/04/2013 Document Reviewed: 01/26/2013 ExitCare Patient Information 2015 [employer]. This information is not intended to replace advice given to you by your health care provider. Make sure you discuss any questions you have with your  health care provider.  

## 2015-02-18 NOTE — MAU Provider Note (Signed)
History     CSN: [id number]  Arrival date and time: 02/18/15 1605   None     No chief complaint on file.  HPI Becky Strickland is 24 y.o. G1P0010 presents with concerns she may be pregnant. She reports a normal period 2 weeks ago.  She began with heavy bleeding yesterday, lighter today, but not describes as spotting.  Last intercourse 4 days ago.  1 partner X 1 yr.  States she is not using contraception.  Denies UTI and vaginal discharge. Hx of SAB 6 months ago.  Past Medical History  Diagnosis Date  . Medical history non-contributory     Past Surgical History  Procedure Laterality Date  . No past surgeries      Family History  Problem Relation Age of Onset  . Fibroids Mother   . Fibroids Maternal Aunt   . Fibroids Maternal Grandmother     History  Substance Use Topics  . Smoking status: Never Smoker   . Smokeless tobacco: Not on file  . Alcohol Use: Yes     Comment: not while pregnant    Allergies: No Known Allergies  Prescriptions prior to admission  Medication Sig Dispense Refill Last Dose  . ibuprofen (ADVIL,MOTRIN) 200 MG tablet Take 400 mg by mouth every 6 (six) hours as needed for moderate pain.    Past Week at Unknown time  . prenatal vitamin w/FE, FA (PRENATAL 1 + 1) 27-1 MG TABS tablet Take 1 tablet by mouth daily. (Patient not taking: Reported on 02/18/2015) 30 each 0 Not Taking at Unknown time    Review of Systems  Constitutional: Negative for fever and chills.  Gastrointestinal: Negative for nausea, vomiting and abdominal pain.  Genitourinary: Negative for dysuria, urgency, frequency and hematuria.       + for vaginal bleeding Neg for vaginal discharge or odor.  Neurological: Negative for headaches.   Physical Exam   Temperature 98.2 F (36.8 C), resp. rate 16, height 5' (1.524 m), weight 110 lb 6 oz (50.066 kg), last menstrual period 02/17/2015, not currently breastfeeding.  Physical Exam  Constitutional: She is oriented to person, place, and  time. She appears well-developed and well-nourished. No distress.  HENT:  Head: Normocephalic.  Neck: Normal range of motion.  Cardiovascular: Normal rate.   Respiratory: Effort normal.  GI: Soft. She exhibits no distension and no mass. There is no tenderness. There is no rebound and no guarding.  Genitourinary: There is no rash, tenderness or lesion on the right labia. There is no rash, tenderness or lesion on the left labia. There is bleeding (scant pink discharge without active bleeding.  Neg for clot) in the vagina. No erythema or tenderness in the vagina. No vaginal discharge found.  Neurological: She is alert and oriented to person, place, and time.  Skin: Skin is warm and dry.  Psychiatric: She has a normal mood and affect. Her behavior is normal.   Results for orders placed or performed during the hospital encounter of 02/18/15 (from the past 24 hour(s))  Urinalysis, Routine w reflex microscopic (not at [facility])     Status: Abnormal   Collection Time: 02/18/15  4:55 PM  Result Value Ref Range   Color, Urine YELLOW YELLOW   APPearance CLEAR CLEAR   Specific Gravity, Urine >1.030 (H) 1.005 - 1.030   pH 5.5 5.0 - 8.0   Glucose, UA NEGATIVE NEGATIVE mg/dL   Hgb urine dipstick NEGATIVE NEGATIVE   Bilirubin Urine NEGATIVE NEGATIVE   Ketones, ur NEGATIVE NEGATIVE  mg/dL   Protein, ur NEGATIVE NEGATIVE mg/dL   Urobilinogen, UA 0.2 0.0 - 1.0 mg/dL   Nitrite NEGATIVE NEGATIVE   Leukocytes, UA NEGATIVE NEGATIVE  Pregnancy, urine POC     Status: None   Collection Time: 02/18/15  5:02 PM  Result Value Ref Range   Preg Test, Ur NEGATIVE NEGATIVE  Wet prep, genital     Status: None   Collection Time: 02/18/15  5:30 PM  Result Value Ref Range   Yeast Wet Prep HPF POC NONE SEEN NONE SEEN   Trich, Wet Prep NONE SEEN NONE SEEN   Clue Cells Wet Prep HPF POC NONE SEEN NONE SEEN   WBC, Wet Prep HPF POC NONE SEEN NONE SEEN    MAU Course  Procedures  GC/CHL cultures  pending  MDM MSE Labs Exam  Assessment and Plan  A:  Mid cycle vaginal bleeding--menorrhagia      R/o STD  P:  If bleeding continues may call GYN Clinic for appointment      Ask patient to consider contraception if a pregnancy is not desired at this time.  She said she would think about it.      D/C to home     Kimeka Badour,EVE M 02/18/2015, 6:00 PM

## 2015-02-18 NOTE — MAU Note (Signed)
Pt states had period 2 weeks ago lasting 5 days. Then a few days ago began bleeding very heavily. Unsure if she may be pregnant.

## 2015-02-19 LAB — GC/CHLAMYDIA PROBE AMP (~~LOC~~) NOT AT ARMC
CHLAMYDIA, DNA PROBE: NEGATIVE
NEISSERIA GONORRHEA: NEGATIVE

## 2015-02-19 LAB — HIV ANTIBODY (ROUTINE TESTING W REFLEX): HIV SCREEN 4TH GENERATION: NONREACTIVE
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
## 2017-01-05 ENCOUNTER — Emergency Department (HOSPITAL_COMMUNITY)
Admission: EM | Admit: 2017-01-05 | Discharge: 2017-01-05 | Disposition: A | Discharge status: Home / Self Care | Payer: Managed Care, Other (non HMO) | Attending: Emergency Medicine | Admitting: Emergency Medicine

## 2017-01-05 ENCOUNTER — Encounter (HOSPITAL_COMMUNITY): Payer: Self-pay | Admitting: *Deleted

## 2017-01-05 DIAGNOSIS — Z711 Person with feared health complaint in whom no diagnosis is made: Secondary | ICD-10-CM

## 2017-01-05 DIAGNOSIS — Z202 Contact with and (suspected) exposure to infections with a predominantly sexual mode of transmission: Secondary | ICD-10-CM | POA: Insufficient documentation

## 2017-01-05 LAB — I-STAT BETA HCG BLOOD, ED (MC, WL, AP ONLY): I-stat hCG, quantitative: <5 m[IU]/mL (ref <5)

## 2017-01-05 LAB — URINALYSIS, ROUTINE W REFLEX MICROSCOPIC
Bilirubin Urine: NEGATIVE
Glucose, UA: NEGATIVE mg/dL
Hgb urine dipstick: NEGATIVE
KETONES UR: NEGATIVE mg/dL
LEUKOCYTES UA: NEGATIVE
NITRITE: NEGATIVE
Protein, ur: NEGATIVE mg/dL
Specific Gravity, Urine: 1.017 (ref 1.005–1.030)
pH: 7 (ref 5.0–8.0)

## 2017-01-05 LAB — WET PREP, GENITAL
CLUE CELLS WET PREP: NONE SEEN
Sperm: NONE SEEN
Trich, Wet Prep: NONE SEEN
WBC WET PREP: NONE SEEN
Yeast Wet Prep HPF POC: NONE SEEN

## 2017-01-05 NOTE — Discharge Instructions (Signed)
Free HIV and STD Testing °These locations offer FREE confidential testing for HIV, Chlamydia, Gonorrhea, and Syphilis. °Non-Traditional Testing Sites Address Telephone ° °[facility] [address], °[place] °[phone] °Mondays 5pm - 7pm ° °[facility] °[address], [address] °[place] °[phone] °Wednesdays 2pm-8pm ° °[facility] °[address], °[place] °[phone] °Thursdays 9am-12noon °1pm-4pm ° °[facility] °[facility] °[address], [place] °[phone] °Tuesdays °Thursdays °9am-12noon °1pm-4pm ° °[facility] offers free, confidential testing and treatment for HIV, Chlamydia, Gonorrhea, Syphilis, Herpes, Bacterial Vaginosis, Yeast, and Trichomoniasis. °Traditional Testing ° ° °[facility] °[address], °[place] °[phone]  °Monday thru Friday  °Call for an appointment ° °[facility] [place] °[facility] °[address] °[place] °[phone] °Monday thru Friday  °Call for anappointment. ° °If you have any questions about this information please call [phone]. °05/21/2011 ° °

## 2017-01-05 NOTE — ED Triage Notes (Signed)
Pt requesting STD check, denies any exposure to STD or any sxs.  States "my best friend said he comes here to get checked."

## 2017-01-05 NOTE — ED Provider Notes (Signed)
[facility]-EMERGENCY DEPT Provider Note   CSN: [id number] Arrival date & time: 01/05/17  1527     History   Chief Complaint Chief Complaint  Patient presents with  . STD check    HPI Becky Strickland is a 26 y.o. female who presents for routine STD check. She is asymptomatic and just wanted to get checked. She is sexually active with men.  HPI  Past Medical History:  Diagnosis Date  . Medical history non-contributory     There are no active problems to display for this patient.   Past Surgical History:  Procedure Laterality Date  . NO PAST SURGERIES      OB History    Gravida Para Term Preterm AB Living   1       1     SAB TAB Ectopic Multiple Live Births     1             Home Medications    Prior to Admission medications   Medication Sig Start Date End Date Taking? Authorizing Provider  ibuprofen (ADVIL,MOTRIN) 200 MG tablet Take 400 mg by mouth every 6 (six) hours as needed for moderate pain.     [provider]  prenatal vitamin w/FE, FA (PRENATAL 1 + 1) 27-1 MG TABS tablet Take 1 tablet by mouth daily. Patient not taking: Reported on 02/18/2015 09/24/14   Danae Orleans, CNM    Family History Family History  Problem Relation Age of Onset  . Fibroids Mother   . Fibroids Maternal Aunt   . Fibroids Maternal Grandmother     Social History Social History  Substance Use Topics  . Smoking status: Never Smoker  . Smokeless tobacco: Never Used  . Alcohol use Yes     Comment: occasionally     Allergies   Patient has no known allergies.   Review of Systems Review of Systems  Constitutional: Negative for chills and fever.  Genitourinary: Negative for vaginal bleeding, vaginal discharge and vaginal pain.     Physical Exam Updated Vital Signs BP 112/76 (BP Location: Left Arm)   Pulse 87   Temp 98.7 F (37.1 C) (Oral)   Resp 17   Wt 59.9 kg (132 lb)   LMP 12/15/2016   SpO2 100%   BMI 25.78 kg/m   Physical Exam  Constitutional: She is  oriented to person, place, and time. She appears well-developed and well-nourished. No distress.  HENT:  Head: Normocephalic and atraumatic.  Eyes: Conjunctivae are normal. No scleral icterus.  Neck: Normal range of motion.  Cardiovascular: Normal rate, regular rhythm and normal heart sounds.  Exam reveals no gallop and no friction rub.   No murmur heard. Pulmonary/Chest: Effort normal and breath sounds normal. No respiratory distress.  Abdominal: Soft. Bowel sounds are normal. She exhibits no distension and no mass. There is no tenderness. There is no guarding.  Neurological: She is alert and oriented to person, place, and time.  Skin: Skin is warm and dry. She is not diaphoretic.  Psychiatric: Her behavior is normal.  Nursing note and vitals reviewed.    ED Treatments / Results  Labs (all labs ordered are listed, but only abnormal results are displayed) Labs Reviewed  WET PREP, GENITAL  URINALYSIS, ROUTINE W REFLEX MICROSCOPIC  RPR  HIV ANTIBODY (ROUTINE TESTING)  I-STAT BETA HCG BLOOD, ED ([facility], [facility], [facility] ONLY)  GC/CHLAMYDIA PROBE AMP ([place]) NOT AT [facility]    EKG  EKG Interpretation None  Radiology No results found.  Procedures Procedures (including critical care time)  Medications Ordered in ED Medications - No data to display   Initial Impression / Assessment and Plan / ED Course  I have reviewed the triage vital signs and the nursing notes.  Pertinent labs & imaging results that were available during my care of the patient were reviewed by me and considered in my medical decision making (see chart for details).     Negative urine, wet prep and negative pregnancy test. Patient will be discharged. Given follow-up information for free STD testing.  Final Clinical Impressions(s) / ED Diagnoses   Final diagnoses:  Concern about STD in female without diagnosis    New Prescriptions New Prescriptions   No medications on file     Arthor CaptainHarris, Yolande Skoda,  PA-C 01/06/17 0157    Loren RacerYelverton, David, MD 01/08/17 [phone]

## 2017-01-06 LAB — HIV ANTIBODY (ROUTINE TESTING W REFLEX): HIV SCREEN 4TH GENERATION: NONREACTIVE

## 2017-01-06 LAB — GC/CHLAMYDIA PROBE AMP (~~LOC~~) NOT AT ARMC
CHLAMYDIA, DNA PROBE: NEGATIVE
NEISSERIA GONORRHEA: NEGATIVE

## 2017-01-06 LAB — RPR: RPR Ser Ql: NONREACTIVE

## 2017-09-02 ENCOUNTER — Other Ambulatory Visit: Payer: Self-pay

## 2017-09-02 ENCOUNTER — Ambulatory Visit (HOSPITAL_COMMUNITY)
Admission: EM | Admit: 2017-09-02 | Discharge: 2017-09-02 | Disposition: A | Discharge status: Home / Self Care | Payer: Managed Care, Other (non HMO) | Attending: Family Medicine | Admitting: Family Medicine

## 2017-09-02 ENCOUNTER — Encounter (HOSPITAL_COMMUNITY): Payer: Self-pay | Admitting: Emergency Medicine

## 2017-09-02 DIAGNOSIS — R824 Acetonuria: Secondary | ICD-10-CM

## 2017-09-02 DIAGNOSIS — R112 Nausea with vomiting, unspecified: Secondary | ICD-10-CM

## 2017-09-02 DIAGNOSIS — Z3202 Encounter for pregnancy test, result negative: Secondary | ICD-10-CM | POA: Diagnosis not present

## 2017-09-02 LAB — POCT URINALYSIS DIP (DEVICE)
BILIRUBIN URINE: NEGATIVE
Glucose, UA: NEGATIVE mg/dL
Hgb urine dipstick: NEGATIVE
KETONES UR: 15 mg/dL — AB
LEUKOCYTES UA: NEGATIVE
Nitrite: NEGATIVE
Protein, ur: 30 mg/dL — AB
SPECIFIC GRAVITY, URINE: 1.025 (ref 1.005–1.030)
Urobilinogen, UA: 1 mg/dL (ref 0.0–1.0)
pH: 6.5 (ref 5.0–8.0)

## 2017-09-02 LAB — POCT PREGNANCY, URINE: Preg Test, Ur: NEGATIVE

## 2017-09-02 MED ORDER — ONDANSETRON 4 MG PO TBDP
ORAL_TABLET | ORAL | Status: AC
  Filled 2017-09-02: qty 1

## 2017-09-02 MED ORDER — ONDANSETRON HCL 4 MG PO TABS: 4.0000 mg | ORAL_TABLET | Freq: Three times a day (TID) | ORAL | 0 refills | Status: DC | PRN

## 2017-09-02 MED ORDER — ONDANSETRON 4 MG PO TBDP
4.0000 mg | ORAL_TABLET | Freq: Once | ORAL | Status: AC
  Administered 2017-09-02: 4 mg via ORAL

## 2017-09-02 NOTE — ED Triage Notes (Signed)
Pt reports vomiting that started today.  She reports 4-5 episodes, last one was 2 hours ago.

## 2017-09-02 NOTE — Discharge Instructions (Signed)
Liquid diet today to ensure adequate hydration. Zofran as needed every 8 hours. Bland diet as tolerated if nausea and vomiting have improved. If worsening of symptoms, pain, fevers, dehydration or otherwise worsening return to be seen or go to Er.

## 2017-09-02 NOTE — ED Provider Notes (Signed)
[facility]    CSN: [id number] Arrival date & time: 09/02/17  1637     History   Chief Complaint Chief Complaint  Patient presents with  . Emesis    HPI Becky Strickland is a 27 y.o. female.   Becky Strickland presents with complaints of nausea and vomiting which started suddenly today. At at [facility] last evening. Woke this morning with mild nausea which led to vomiting. She vomited approximately 5 times today. Last episode approximately 2.5 hours ago. States she drank a sprite on the way here and it has stayed down. Still urinating. Denies abdominal pain, diarrhea or fevers. States she has had some ill coworkers. She is otherwise healthy without medical problems, does not take any medications regularly.   ROS per HPI.       Past Medical History:  Diagnosis Date  . Medical history non-contributory     There are no active problems to display for this patient.   Past Surgical History:  Procedure Laterality Date  . NO PAST SURGERIES      OB History    Gravida Para Term Preterm AB Living   1       1     SAB TAB Ectopic Multiple Live Births     1             Home Medications    Prior to Admission medications   Medication Sig Start Date End Date Taking? Authorizing Provider  ibuprofen (ADVIL,MOTRIN) 200 MG tablet Take 400 mg by mouth every 6 (six) hours as needed for moderate pain.     [provider]  ondansetron (ZOFRAN) 4 MG tablet Take 1 tablet (4 mg total) by mouth every 8 (eight) hours as needed for nausea or vomiting. 09/02/17   Georgetta Haber, NP  prenatal vitamin w/FE, FA (PRENATAL 1 + 1) 27-1 MG TABS tablet Take 1 tablet by mouth daily. Patient not taking: Reported on 02/18/2015 09/24/14   Danae Orleans, CNM    Family History Family History  Problem Relation Age of Onset  . Fibroids Mother   . Fibroids Maternal Aunt   . Fibroids Maternal Grandmother     Social History Social History   Tobacco Use  . Smoking status: Never Smoker  .  Smokeless tobacco: Never Used  Substance Use Topics  . Alcohol use: Yes    Comment: occasionally  . Drug use: No     Allergies   Patient has no known allergies.   Review of Systems Review of Systems   Physical Exam Triage Vital Signs ED Triage Vitals [09/02/17 1702]  Enc Vitals Group     BP 106/68     Pulse Rate (!) 101     Resp 16     Temp 99.4 F (37.4 C)     Temp Source Oral     SpO2 99 %     Weight 135 lb (61.2 kg)     Height 5\' 1"  (1.549 m)     Head Circumference      Peak Flow      Pain Score      Pain Loc      Pain Edu?      Excl. in GC?    No data found.  Updated Vital Signs BP 108/65 (BP Location: Right Arm)   Pulse 93   Temp 98.7 F (37.1 C) (Oral)   Resp 16   Ht 5\' 1"  (1.549 m)   Wt 135 lb (61.2 kg)  LMP 08/28/2017 (Exact Date)   SpO2 100%   BMI 25.51 kg/m   Visual Acuity Right Eye Distance:   Left Eye Distance:   Bilateral Distance:    Right Eye Near:   Left Eye Near:    Bilateral Near:     Physical Exam  Constitutional: She is oriented to person, place, and time. She appears well-developed and well-nourished. No distress.  Cardiovascular: Regular rhythm and normal heart sounds. Tachycardia present.  Pulmonary/Chest: Effort normal and breath sounds normal.  Abdominal: Soft. She exhibits no distension and no mass. There is no tenderness. There is no rigidity, no rebound, no guarding, no CVA tenderness, no tenderness at McBurney's point and negative Murphy's sign.  Neurological: She is alert and oriented to person, place, and time.  Skin: Skin is warm and dry.     UC Treatments / Results  Labs (all labs ordered are listed, but only abnormal results are displayed) Labs Reviewed  POCT URINALYSIS DIP (DEVICE) - Abnormal; Notable for the following components:      Result Value   Ketones, ur 15 (*)    Protein, ur 30 (*)    All other components within normal limits  POCT PREGNANCY, URINE    EKG  EKG Interpretation None        Radiology No results found.  Procedures Procedures (including critical care time)  Medications Ordered in UC Medications  ondansetron (ZOFRAN-ODT) disintegrating tablet 4 mg (4 mg Oral Given 09/02/17 1721)     Initial Impression / Assessment and Plan / UC Course  I have reviewed the triage vital signs and the nursing notes.  Pertinent labs & imaging results that were available during my care of the patient were reviewed by me and considered in my medical decision making (see chart for details).  Clinical Course as of Sep 02 1802  Thu Sep 02, 2017  1738 Water provided after administering zofran  [NB]    Clinical Course User Index [NB] Linus MakoBurky, Lindalou Soltis B, NP    Ketones noted to urine and mild tachycardia noted. Vomiting has started to decrease in frequency of episodes on arrival to clinic. zofran provided in clinic. Tolerated water, vitals improving. Encouraged increased fluid intake. zofran as needed. Return precautions provided. Patient verbalized understanding and agreeable to plan.    Final Clinical Impressions(s) / UC Diagnoses   Final diagnoses:  Non-intractable vomiting with nausea, unspecified vomiting type    ED Discharge Orders        Ordered    ondansetron (ZOFRAN) 4 MG tablet  Every 8 hours PRN     09/02/17 1802       Controlled Substance Prescriptions [place] Controlled Substance Registry consulted? Not Applicable   Georgetta HaberBurky, Kaymen Adrian B, NP 09/02/17 [phone]

## 2019-12-14 ENCOUNTER — Ambulatory Visit: Payer: Managed Care, Other (non HMO)

## 2021-12-31 ENCOUNTER — Inpatient Hospital Stay (HOSPITAL_COMMUNITY)
Admission: AD | Admit: 2021-12-31 | Discharge: 2021-12-31 | Disposition: A | Discharge status: Home / Self Care | Payer: Medicaid Other | Attending: Obstetrics and Gynecology | Admitting: Obstetrics and Gynecology

## 2021-12-31 ENCOUNTER — Other Ambulatory Visit: Payer: Self-pay

## 2021-12-31 ENCOUNTER — Inpatient Hospital Stay (HOSPITAL_COMMUNITY): Payer: Medicaid Other

## 2021-12-31 ENCOUNTER — Ambulatory Visit (INDEPENDENT_AMBULATORY_CARE_PROVIDER_SITE_OTHER): Payer: Medicaid Other | Admitting: Family Medicine

## 2021-12-31 ENCOUNTER — Encounter: Payer: Self-pay | Admitting: Family Medicine

## 2021-12-31 VITALS — BP 104/62 | HR 68 | Ht 62.5 in | Wt 151.0 lb

## 2021-12-31 DIAGNOSIS — O209 Hemorrhage in early pregnancy, unspecified: Secondary | ICD-10-CM | POA: Diagnosis not present

## 2021-12-31 DIAGNOSIS — Z0001 Encounter for general adult medical examination with abnormal findings: Secondary | ICD-10-CM

## 2021-12-31 DIAGNOSIS — O3410 Maternal care for benign tumor of corpus uteri, unspecified trimester: Secondary | ICD-10-CM | POA: Diagnosis not present

## 2021-12-31 DIAGNOSIS — E559 Vitamin D deficiency, unspecified: Secondary | ICD-10-CM

## 2021-12-31 DIAGNOSIS — O3680X0 Pregnancy with inconclusive fetal viability, not applicable or unspecified: Secondary | ICD-10-CM | POA: Insufficient documentation

## 2021-12-31 DIAGNOSIS — Z3490 Encounter for supervision of normal pregnancy, unspecified, unspecified trimester: Secondary | ICD-10-CM | POA: Diagnosis not present

## 2021-12-31 DIAGNOSIS — R7301 Impaired fasting glucose: Secondary | ICD-10-CM | POA: Diagnosis not present

## 2021-12-31 DIAGNOSIS — Z3202 Encounter for pregnancy test, result negative: Secondary | ICD-10-CM

## 2021-12-31 DIAGNOSIS — R319 Hematuria, unspecified: Secondary | ICD-10-CM | POA: Diagnosis not present

## 2021-12-31 DIAGNOSIS — Z3200 Encounter for pregnancy test, result unknown: Secondary | ICD-10-CM | POA: Insufficient documentation

## 2021-12-31 DIAGNOSIS — Z131 Encounter for screening for diabetes mellitus: Secondary | ICD-10-CM | POA: Diagnosis not present

## 2021-12-31 DIAGNOSIS — E7800 Pure hypercholesterolemia, unspecified: Secondary | ICD-10-CM | POA: Diagnosis not present

## 2021-12-31 LAB — COMPREHENSIVE METABOLIC PANEL
ALT: 11 U/L (ref 0–44)
AST: 14 U/L — ABNORMAL LOW (ref 15–41)
Albumin: 3.6 g/dL (ref 3.5–5.0)
Alkaline Phosphatase: 33 U/L — ABNORMAL LOW (ref 38–126)
Anion gap: 5 (ref 5–15)
BUN: 5 mg/dL — ABNORMAL LOW (ref 6–20)
CO2: 25 mmol/L (ref 22–32)
Calcium: 8.9 mg/dL (ref 8.9–10.3)
Chloride: 108 mmol/L (ref 98–111)
Creatinine, Ser: 0.79 mg/dL (ref 0.44–1.00)
GFR, Estimated: >60 mL/min (ref >60)
Glucose, Bld: 97 mg/dL (ref 70–99)
Potassium: 3.8 mmol/L (ref 3.5–5.1)
Sodium: 138 mmol/L (ref 135–145)
Total Bilirubin: 0.3 mg/dL (ref 0.3–1.2)
Total Protein: 7.3 g/dL (ref 6.5–8.1)

## 2021-12-31 LAB — CBC
HCT: 37.1 % (ref 36.0–46.0)
Hemoglobin: 11.9 g/dL — ABNORMAL LOW (ref 12.0–15.0)
MCH: 27.3 pg (ref 26.0–34.0)
MCHC: 32.1 g/dL (ref 30.0–36.0)
MCV: 85.1 fL (ref 80.0–100.0)
Platelets: 350 10*3/uL (ref 150–400)
RBC: 4.36 MIL/uL (ref 3.87–5.11)
RDW: 12.8 % (ref 11.5–15.5)
WBC: 4.9 10*3/uL (ref 4.0–10.5)
nRBC: 0 % (ref 0.0–0.2)

## 2021-12-31 LAB — HCG, QUANTITATIVE, PREGNANCY: hCG, Beta Chain, Quant, S: 53 m[IU]/mL — ABNORMAL HIGH (ref <5)

## 2021-12-31 LAB — POCT URINALYSIS DIP (CLINITEK)
Bilirubin, UA: NEGATIVE
Glucose, UA: NEGATIVE mg/dL
Ketones, POC UA: NEGATIVE mg/dL
Leukocytes, UA: NEGATIVE
Nitrite, UA: NEGATIVE
POC PROTEIN,UA: NEGATIVE
Spec Grav, UA: <=1.005 — AB (ref 1.010–1.025)
Urobilinogen, UA: 0.2 E.U./dL
pH, UA: 6 (ref 5.0–8.0)

## 2021-12-31 LAB — ABO/RH: ABO/RH(D): O POS

## 2021-12-31 LAB — POCT URINE PREGNANCY: Preg Test, Ur: NEGATIVE

## 2021-12-31 NOTE — Patient Instructions (Signed)
I appreciate the opportunity to provide care to you today!    Follow up:  5 months  Labs: please stop by the lab today to get your blood drawn (CBC, CMP, TSH, Lipid profile, HgA1c, Vit D)  Referrals today-  GYN   Please continue to a heart-healthy diet and increase your physical activities. Try to exercise for at least three times a week.      It was a pleasure to see you and I look forward to continuing to work together on your health and well-being. Please do not hesitate to call the office if you need care or have questions about your care.   Have a wonderful day and week. With Gratitude, Gilmore Laroche MSN, FNP-BC

## 2021-12-31 NOTE — MAU Note (Signed)
Becky Strickland is a 31 y.o. here in MAU reporting: thinks she may be pregnant, has had several + UPT at home. Was just at the office and had a negative UPT there. Started spotting on Friday and Saturday and then it stopped and started back today. Is wearing a pad. Having some abdominal cramping intermittently.  LMP: 11/14/21 approximately   Onset of complaint: ongoing  Pain score: 0/10  Vitals:   12/31/21 1115  BP: 111/73  Pulse: 84  Resp: 16  Temp: 98.5 F (36.9 C)  SpO2: 97%     Lab orders placed from triage: none

## 2021-12-31 NOTE — MAU Provider Note (Signed)
History    [id number]  Arrival date and time: 12/31/21 1104  Chief Complaint  Patient presents with   Positive pregnancy test   HPI Becky Strickland is a 31 y.o. female who presents to MAU after having multiple positive pregnancy tests at home. She was seen by [facility] this AM and had a negative pregnancy test in office. Her LMP was around 11/14/21. She did start spotting last Friday on 6/16, which stopped briefly and then started again today. She has some cramping here and there. She states this is mild. She has come to MAU for further evaluation. She has no other concerns at this time.   Denies fevers, chills, dysuria, N/V, or vaginal discharge.  OB History     Gravida  1   Para      Term      Preterm      AB  1   Living         SAB      IAB  1   Ectopic      Multiple      Live Births              Past Medical History:  Diagnosis Date   Medical history non-contributory     Past Surgical History:  Procedure Laterality Date   NO PAST SURGERIES      Family History  Problem Relation Age of Onset   Fibroids Mother    Fibroids Maternal Aunt    Fibroids Maternal Grandmother     Social History   Socioeconomic History   Marital status: Single    Spouse name: Not on file   Number of children: Not on file   Years of education: Not on file   Highest education level: Not on file  Occupational History   Not on file  Tobacco Use   Smoking status: Never   Smokeless tobacco: Never  Substance and Sexual Activity   Alcohol use: Yes    Comment: occasionally   Drug use: No   Sexual activity: Yes    Comment: last intercurse 27 Jul 2014  Other Topics Concern   Not on file  Social History Narrative   Not on file   Social Determinants of Health   Financial Resource Strain: Not on file  Food Insecurity: Not on file  Transportation Needs: Not on file  Physical Activity: Not on file  Stress: Not on file  Social Connections: Not on file  Intimate  Partner Violence: Not on file    No Known Allergies  No current facility-administered medications on file prior to encounter.   Current Outpatient Medications on File Prior to Encounter  Medication Sig Dispense Refill   ondansetron (ZOFRAN) 4 MG tablet Take 1 tablet (4 mg total) by mouth every 8 (eight) hours as needed for nausea or vomiting. (Patient not taking: Reported on 12/31/2021) 10 tablet 0   prenatal vitamin w/FE, FA (PRENATAL 1 + 1) 27-1 MG TABS tablet Take 1 tablet by mouth daily. (Patient not taking: Reported on 02/18/2015) 30 each 0   ROS Pertinent positives and negative per HPI, all others reviewed and negative.  Physical Exam   BP 111/73 (BP Location: Right Arm)   Pulse 84   Temp 98.5 F (36.9 C) (Oral)   Resp 16   Ht 5\' 2"  (1.575 m)   Wt 68.1 kg   LMP 11/14/2021 (Approximate)   SpO2 97% Comment: room air  BMI 27.45 kg/m   Physical Exam Constitutional:  General: She is not in acute distress.    Appearance: Normal appearance.  HENT:     Head: Normocephalic and atraumatic.     Mouth/Throat:     Mouth: Mucous membranes are moist.  Eyes:     Extraocular Movements: Extraocular movements intact.     Conjunctiva/sclera: Conjunctivae normal.  Cardiovascular:     Rate and Rhythm: Normal rate.  Pulmonary:     Effort: Pulmonary effort is normal.  Abdominal:     Palpations: Abdomen is soft.     Tenderness: There is no abdominal tenderness. There is no guarding or rebound.  Musculoskeletal:        General: Normal range of motion.  Skin:    General: Skin is warm and dry.  Neurological:     General: No focal deficit present.     Mental Status: She is alert and oriented to person, place, and time.  Psychiatric:        Mood and Affect: Mood normal.        Behavior: Behavior normal.    Labs Results for orders placed or performed during the hospital encounter of 12/31/21 (from the past 24 hour(s))  hCG, quantitative, pregnancy     Status: Abnormal    Collection Time: 12/31/21 11:30 AM  Result Value Ref Range   hCG, Beta Chain, Quant, S 53 (H) <5 mIU/mL  ABO/Rh     Status: None   Collection Time: 12/31/21  1:37 PM  Result Value Ref Range   ABO/RH(D) O POS    No rh immune globuloin      NOT A RH IMMUNE GLOBULIN CANDIDATE, PT RH POSITIVE Performed at [facility], [address], [place], [place] [address]   CBC     Status: Abnormal   Collection Time: 12/31/21  1:37 PM  Result Value Ref Range   WBC 4.9 4.0 - 10.5 K/uL   RBC 4.36 3.87 - 5.11 MIL/uL   Hemoglobin 11.9 (L) 12.0 - 15.0 g/dL   HCT 00.9 38.1 - 82.9 %   MCV 85.1 80.0 - 100.0 fL   MCH 27.3 26.0 - 34.0 pg   MCHC 32.1 30.0 - 36.0 g/dL   RDW 93.7 16.9 - 67.8 %   Platelets 350 150 - 400 K/uL   nRBC 0.0 0.0 - 0.2 %  Comprehensive metabolic panel     Status: Abnormal   Collection Time: 12/31/21  1:37 PM  Result Value Ref Range   Sodium 138 135 - 145 mmol/L   Potassium 3.8 3.5 - 5.1 mmol/L   Chloride 108 98 - 111 mmol/L   CO2 25 22 - 32 mmol/L   Glucose, Bld 97 70 - 99 mg/dL   BUN 5 (L) 6 - 20 mg/dL   Creatinine, Ser 9.38 0.44 - 1.00 mg/dL   Calcium 8.9 8.9 - 10.1 mg/dL   Total Protein 7.3 6.5 - 8.1 g/dL   Albumin 3.6 3.5 - 5.0 g/dL   AST 14 (L) 15 - 41 U/L   ALT 11 0 - 44 U/L   Alkaline Phosphatase 33 (L) 38 - 126 U/L   Total Bilirubin 0.3 0.3 - 1.2 mg/dL   GFR, Estimated >75 >10 mL/min   Anion gap 5 5 - 15   Imaging US OB LESS THAN 14 WEEKS WITH OB TRANSVAGINAL  Result Date: 12/31/2021 CLINICAL DATA:  Vaginal bleeding. EXAM: OBSTETRIC <14 WK [place] AND TRANSVAGINAL OB US TECHNIQUE: Both transabdominal and transvaginal ultrasound examinations were performed for complete evaluation of the gestation  as well as the maternal uterus, adnexal regions, and pelvic cul-de-sac. Transvaginal technique was performed to assess early pregnancy. COMPARISON:  None Available. FINDINGS: Intrauterine gestational sac: None Yolk sac:  Not Visualized. Embryo:  Not Visualized. Cardiac  Activity: Not Visualized. Subchorionic hemorrhage:  None visualized. Maternal uterus/adnexae: Ovaries are unremarkable. No free fluid is noted. 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 IMPRESSION: No intrauterine gestational sac, yolk sac, fetal pole, or cardiac activity visualized. Differential considerations include intrauterine gestation too early to be sonographically visualized, spontaneous abortion, or ectopic pregnancy. Consider follow-up ultrasound in 14 days and serial quantitative beta HCG follow-up. Electronically Signed   By: Lupita Raider M.D.   On: 12/31/2021 14:17    MAU Course  Procedures Lab Orders         hCG, quantitative, pregnancy         CBC         Comprehensive metabolic panel    No orders of the defined types were placed in this encounter.  Imaging Orders         US OB LESS THAN 14 WEEKS WITH OB TRANSVAGINAL     MDM Upon arrival, VSS, NAD Abdominal exam benign UA in FM office unremarkable, UPT negative in office hCG obtained in MAU and 53 CBC and CMP unremarkable, O POS  US obtained and revealed no IUP; no other acute findings noted   Assessment and Plan   1. Pregnancy of unknown anatomic location - Discussed potential etiologies including early pregnancy, SAB, or ectopic pregnancy  - Given history of positive UPT which is now negative with low hCG, suspicious for SAB - Will follow up in office for STAT hCG in 48 hours, appointment made  - Strict return precautions reviewed and patient voiced understanding  - All questions and concerns addressed   Worthy Rancher, MD

## 2021-12-31 NOTE — Assessment & Plan Note (Addendum)
-  She reports taking 3 home pregnancy tests that were positive -urine test in the clinic is negative -the patient and her mother would like a referral to GYN for a second opinion since the patient had 3 positive home results - referral placed

## 2021-12-31 NOTE — Progress Notes (Signed)
New Patient Office Visit  Subjective:  Patient ID: Becky Strickland, female    DOB: 1990-08-08  Age: 31 y.o. MRN: [id number]  CC:  Chief Complaint  Patient presents with   New Patient (Initial Visit)    Pt will be establishing care, c/o spotting last week and started bleeding again this morning.     HPI Becky Strickland is a 31 y.o. female who presents for establishing care. Possible pregnancy: last menstrual cycle was on Nov 14, 2021. The last intercourse was on Dec 10, 2021. She reports taking three home pregnancy tests that were positive. She had some light spotting from  12/27/21 to 12/28/21. She noted having mild abdominal cramps the night of 12/30/21 and woke up on 12/31/21 with light bleeding that she only notices when she wipes. Her mom and husband accompanied her. The patient's mother notes that she had placenta previa while pregnant with the patient.     Past Medical History:  Diagnosis Date   Medical history non-contributory     Past Surgical History:  Procedure Laterality Date   NO PAST SURGERIES      Family History  Problem Relation Age of Onset   Fibroids Mother    Fibroids Maternal Aunt    Fibroids Maternal Grandmother     Social History   Socioeconomic History   Marital status: Single    Spouse name: Not on file   Number of children: Not on file   Years of education: Not on file   Highest education level: Not on file  Occupational History   Not on file  Tobacco Use   Smoking status: Never   Smokeless tobacco: Never  Substance and Sexual Activity   Alcohol use: Yes    Comment: occasionally   Drug use: No   Sexual activity: Yes    Comment: last intercurse 27 Jul 2014  Other Topics Concern   Not on file  Social History Narrative   Not on file   Social Determinants of Health   Financial Resource Strain: Not on file  Food Insecurity: Not on file  Transportation Needs: Not on file  Physical Activity: Not on file  Stress: Not on file  Social  Connections: Not on file  Intimate Partner Violence: Not on file    ROS Review of Systems  Constitutional:  Negative for chills, fatigue and fever.  HENT:  Negative for congestion, sinus pressure and sinus pain.   Eyes:  Negative for photophobia, pain and redness.  Respiratory:  Negative for chest tightness and shortness of breath.   Cardiovascular:  Negative for chest pain and palpitations.  Gastrointestinal:  Positive for abdominal pain. Negative for abdominal distention, constipation, diarrhea and nausea.  Endocrine: Negative for polydipsia, polyphagia and polyuria.  Genitourinary:  Negative for frequency and urgency.  Musculoskeletal:  Negative for back pain and gait problem.  Skin:  Negative for rash and wound.  Neurological:  Negative for weakness, light-headedness and headaches.  Psychiatric/Behavioral:  Negative for confusion, self-injury and suicidal ideas.     Objective:   Today's Vitals: BP 104/62   Pulse 68   Ht 5' 2.5" (1.588 m)   Wt 151 lb (68.5 kg)   LMP 11/14/2021 (Approximate)   SpO2 94%   BMI 27.18 kg/m   Physical Exam Constitutional:      Appearance: Normal appearance.  HENT:     Head: Normocephalic.     Right Ear: External ear normal.     Left Ear: External ear normal.  Nose: No congestion or rhinorrhea.     Mouth/Throat:     Mouth: Mucous membranes are moist.  Eyes:     Extraocular Movements: Extraocular movements intact.     Pupils: Pupils are equal, round, and reactive to light.  Cardiovascular:     Rate and Rhythm: Normal rate and regular rhythm.     Pulses: Normal pulses.     Heart sounds: Normal heart sounds.  Pulmonary:     Effort: Pulmonary effort is normal.     Breath sounds: Normal breath sounds.  Abdominal:     Palpations: Abdomen is soft.     Tenderness: There is no right CVA tenderness or left CVA tenderness.  Musculoskeletal:     Cervical back: No rigidity.     Right lower leg: No edema.     Left lower leg: No edema.   Skin:    Coloration: Skin is not jaundiced.     Findings: No lesion.  Neurological:     Mental Status: She is alert and oriented to person, place, and time.     Assessment & Plan:   Problem List Items Addressed This Visit       Other   Encounter for pregnancy test - Primary    -She reports taking 3 home pregnancy tests that were positive -urine test in the clinic is negative -the patient and her mother would like a referral to GYN for a second opinion since the patient had 3 positive home results - referral placed        Relevant Orders   Ambulatory referral to Gynecology   Other Visit Diagnoses     Pregnancy, unspecified gestational age       Relevant Orders   POCT urine pregnancy (Completed)   IFG (impaired fasting glucose)       Relevant Orders   Hemoglobin A1C   Vitamin D deficiency       Relevant Orders   VITAMIN D 25 Hydroxy (Vit-D Deficiency, Fractures)   Encounter for general adult medical examination with abnormal findings       Relevant Orders   CBC with Differential/Platelet   CMP14+EGFR   TSH + free T4   Lipid panel   Hematuria of unknown cause       Relevant Orders   POCT URINALYSIS DIP (CLINITEK) (Completed)       Outpatient Encounter Medications as of 12/31/2021  Medication Sig   ibuprofen (ADVIL,MOTRIN) 200 MG tablet Take 400 mg by mouth every 6 (six) hours as needed for moderate pain.  (Patient not taking: Reported on 12/31/2021)   ondansetron (ZOFRAN) 4 MG tablet Take 1 tablet (4 mg total) by mouth every 8 (eight) hours as needed for nausea or vomiting. (Patient not taking: Reported on 12/31/2021)   prenatal vitamin w/FE, FA (PRENATAL 1 + 1) 27-1 MG TABS tablet Take 1 tablet by mouth daily. (Patient not taking: Reported on 02/18/2015)   No facility-administered encounter medications on file as of 12/31/2021.    Follow-up: Return in about 5 months (around 06/02/2022).   Alvira Monday, FNP

## 2022-01-01 ENCOUNTER — Other Ambulatory Visit: Payer: Self-pay | Admitting: Family Medicine

## 2022-01-01 DIAGNOSIS — E559 Vitamin D deficiency, unspecified: Secondary | ICD-10-CM

## 2022-01-01 LAB — LIPID PANEL
Chol/HDL Ratio: 4.8 ratio — ABNORMAL HIGH (ref 0.0–4.4)
HDL: 37 mg/dL — ABNORMAL LOW (ref >39)
LDL Chol Calc (NIH): 127 mg/dL — ABNORMAL HIGH (ref 0–99)
Triglycerides: 75 mg/dL (ref 0–149)

## 2022-01-01 LAB — CBC WITH DIFFERENTIAL/PLATELET
Eos: 3 %
Hemoglobin: 12.7 g/dL (ref 11.1–15.9)
MCH: 28.5 pg (ref 26.6–33.0)
MCHC: 34 g/dL (ref 31.5–35.7)
MCV: 84 fL (ref 79–97)
Neutrophils Absolute: 2.6 10*3/uL (ref 1.4–7.0)
Neutrophils: 55 %
Platelets: 377 10*3/uL (ref 150–450)

## 2022-01-01 LAB — CMP14+EGFR
ALT: 11 IU/L (ref 0–32)
Albumin/Globulin Ratio: 1.3 (ref 1.2–2.2)
Albumin: 4.2 g/dL (ref 3.9–5.0)
Alkaline Phosphatase: 42 IU/L — ABNORMAL LOW (ref 44–121)
BUN/Creatinine Ratio: 8 — ABNORMAL LOW (ref 9–23)
BUN: 7 mg/dL (ref 6–20)
Bilirubin Total: <0.2 mg/dL (ref 0.0–1.2)
CO2: 21 mmol/L (ref 20–29)
Calcium: 9.1 mg/dL (ref 8.7–10.2)
Globulin, Total: 3.3 g/dL (ref 1.5–4.5)
Glucose: 90 mg/dL (ref 70–99)
Potassium: 4.2 mmol/L (ref 3.5–5.2)
Total Protein: 7.5 g/dL (ref 6.0–8.5)

## 2022-01-01 LAB — VITAMIN D 25 HYDROXY (VIT D DEFICIENCY, FRACTURES): Vit D, 25-Hydroxy: 8.5 ng/mL — ABNORMAL LOW (ref 30.0–100.0)

## 2022-01-01 LAB — HEMOGLOBIN A1C: Hgb A1c MFr Bld: 5.4 % (ref 4.8–5.6)

## 2022-01-01 MED ORDER — VITAMIN D (ERGOCALCIFEROL) 1.25 MG (50000 UNIT) PO CAPS: 50000.0000 [IU] | ORAL_CAPSULE | ORAL | 1 refills | Status: DC

## 2022-01-01 NOTE — Progress Notes (Signed)
Please inform the patient to take Vit D once weekly supplement for her low Vit D levels. The prescription is sent to her pharmacy.

## 2022-01-02 ENCOUNTER — Ambulatory Visit (INDEPENDENT_AMBULATORY_CARE_PROVIDER_SITE_OTHER): Payer: Medicaid Other

## 2022-01-02 ENCOUNTER — Ambulatory Visit: Payer: Medicaid Other

## 2022-01-02 ENCOUNTER — Telehealth: Payer: Self-pay

## 2022-01-02 VITALS — BP 116/78 | HR 71 | Temp 98.0°F

## 2022-01-02 DIAGNOSIS — O3680X0 Pregnancy with inconclusive fetal viability, not applicable or unspecified: Secondary | ICD-10-CM

## 2022-01-02 LAB — BETA HCG QUANT (REF LAB): hCG Quant: 8 m[IU]/mL

## 2022-01-02 NOTE — Progress Notes (Addendum)
Beta HCG Follow-up Visit  Becky Strickland presents to [facility] for follow-up beta HCG lab. She was seen in MAU for vaginal bleeding during early pregnancy on 12/31/21. Pt reports continued vaginal bleeding, soaking pads every 2-3 hours. Pt states she feels this is a miscarriage. Discussed with patient that we are following beta HCG levels today. Valid contact number for patient confirmed. Bleeding precautions reviewed. Explained results will be called to on call provider. Reviewed pt's history with Alvester Morin, MD via telephone.   Marjo Bicker 01/02/2022 11:11 AM

## 2022-01-05 ENCOUNTER — Ambulatory Visit: Payer: Medicaid Other

## 2022-07-13 NOTE — L&D Delivery Note (Addendum)
OB/GYN Faculty Practice Delivery Note  Becky Strickland is a 32 y.o. G3P0020 s/p SVD at [redacted]w[redacted]d. She was admitted for IOL secondary to oligohydramnios.   ROM: 25h 27m with heavy meconium stained fluid GBS Status: Negative/-- (10/07 0936) Maximum Maternal Temperature: 101.57F -- treated for chorioamnionitis during labor   Labor Progress: Initial SVE: 2/60/-3. She then progressed to complete.   Delivery Date/Time: 05/17/23 1523 Delivery: Called to room and patient was complete and pushing. Head delivered LOA. No nuchal cord present. Shoulder and body delivered in usual fashion. Infant had wet cough but spontaneous cry on delivery, otherwise vigorous with drying/stimulating. Cord was clamped and cut by father of baby, Wilber Oliphant, and infant was taken over to warmer for suctioning. Cord blood drawn. Placenta delivered spontaneously with gentle cord traction. Fundus firm with massage and Pitocin. Labia, perineum, vagina, and cervix inspected inspected with a second degree laceration which was repaired in the usual fashion.  Baby Weight: pending  Placenta: Sent to pathology Complications: None Lacerations: Second degree, repaired EBL: 256 mL Analgesia: Epidural   Infant:  APGAR (1 MIN): 8  APGAR (5 MINS): 9   Sundra Aland, MD OB Fellow, Faculty Practice [facility]

## 2022-09-05 ENCOUNTER — Encounter (HOSPITAL_COMMUNITY): Payer: Self-pay

## 2022-09-05 ENCOUNTER — Inpatient Hospital Stay (HOSPITAL_COMMUNITY)
Admission: AD | Admit: 2022-09-05 | Discharge: 2022-09-05 | Disposition: A | Discharge status: Home / Self Care | Payer: Medicaid Other | Attending: Family Medicine | Admitting: Family Medicine

## 2022-09-05 ENCOUNTER — Other Ambulatory Visit: Payer: Self-pay

## 2022-09-05 DIAGNOSIS — O219 Vomiting of pregnancy, unspecified: Secondary | ICD-10-CM | POA: Diagnosis not present

## 2022-09-05 DIAGNOSIS — O09291 Supervision of pregnancy with other poor reproductive or obstetric history, first trimester: Secondary | ICD-10-CM | POA: Diagnosis not present

## 2022-09-05 DIAGNOSIS — Z3A01 Less than 8 weeks gestation of pregnancy: Secondary | ICD-10-CM | POA: Diagnosis not present

## 2022-09-05 LAB — URINALYSIS, ROUTINE W REFLEX MICROSCOPIC
Bacteria, UA: NONE SEEN
Bilirubin Urine: NEGATIVE
Glucose, UA: NEGATIVE mg/dL
Hgb urine dipstick: NEGATIVE
Ketones, ur: NEGATIVE mg/dL
Leukocytes,Ua: NEGATIVE
Nitrite: NEGATIVE
Protein, ur: 30 mg/dL — AB
Specific Gravity, Urine: 1.026 (ref 1.005–1.030)
pH: 8 (ref 5.0–8.0)

## 2022-09-05 LAB — POCT PREGNANCY, URINE: Preg Test, Ur: POSITIVE — AB

## 2022-09-05 MED ORDER — PROMETHAZINE HCL 25 MG PO TABS: 25.0000 mg | ORAL_TABLET | Freq: Four times a day (QID) | ORAL | 0 refills | Status: DC | PRN

## 2022-09-05 NOTE — Discharge Instructions (Signed)
Prenatal Care Providers           [facility]  [address] [phone]  [facility]   [address]  [phone]  [facility] [place]       [address] [phone]            [facility] @ [place]     [address] [phone]          [facility] @ [place]   [address] [phone]  [facility]  [address] [phone]     [facility]  [address]   [phone]     [facility]  Phone: [phone]  [facility]  Phone: [phone]  [facility] Phone: [phone]  [facility] Phone: [phone]  [facility] Phone: [phone]  [facility] Phone: [phone]  [facility]  Phone: [phone]  

## 2022-09-05 NOTE — MAU Note (Signed)
.  Becky Strickland is a 32 y.o. at Unknown here in MAU reporting: nausea that has been going on for about a week. "A little bit" of vomiting that comes with certain foods or smells. Denies pain, VB, or abnormal discharge. 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  LMP: 08/13/2022 Onset of complaint: a week  Pain score: 0 Vitals:   09/05/22 2158  BP: 128/78  Pulse: 99  Resp: 17  Temp: 99.3 F (37.4 C)     FHT:NA Lab orders placed from triage:  urine pregnancy; UA

## 2022-09-05 NOTE — MAU Provider Note (Signed)
History     CSN: [id number]  Arrival date and time: 09/05/22 2134   Event Date/Time   First Provider Initiated Contact with Patient 09/05/22 2210      Chief Complaint  Patient presents with   Nausea   Becky Strickland is a 32 y.o. G2P0010 at 55w2dwho presents today with nausea and vomiting. She states that she has had this for a few days. She has not taken anything for the nausea at this time. She is unsure where she will go for prenatal care. She is mostly concerned today because she has had 2 prior miscarriages and she would like to know what she should be doing to hopefully not have another miscarriage. She denies any abdominal pain or bleeding at this time.    OB History     Gravida  2   Para      Term      Preterm      AB  1   Living         SAB      IAB  1   Ectopic      Multiple      Live Births              Past Medical History:  Diagnosis Date   Medical history non-contributory     Past Surgical History:  Procedure Laterality Date   NO PAST SURGERIES      Family History  Problem Relation Age of Onset   Fibroids Mother    Fibroids Maternal Aunt    Fibroids Maternal Grandmother     Social History   Tobacco Use   Smoking status: Never   Smokeless tobacco: Never  Substance Use Topics   Alcohol use: Yes    Comment: occasionally   Drug use: No    Allergies: No Known Allergies  Medications Prior to Admission  Medication Sig Dispense Refill Last Dose   ondansetron (ZOFRAN) 4 MG tablet Take 1 tablet (4 mg total) by mouth every 8 (eight) hours as needed for nausea or vomiting. (Patient not taking: Reported on 12/31/2021) 10 tablet 0    prenatal vitamin w/FE, FA (PRENATAL 1 + 1) 27-1 MG TABS tablet Take 1 tablet by mouth daily. (Patient not taking: Reported on 02/18/2015) 30 each 0    Vitamin D, Ergocalciferol, (DRISDOL) 1.25 MG (50000 UNIT) CAPS capsule Take 1 capsule (50,000 Units total) by mouth every 7 (seven) days. (Patient not taking:  Reported on 01/02/2022) 5 capsule 1     Review of Systems  All other systems reviewed and are negative.  Physical Exam   Blood pressure 128/78, pulse 99, temperature 99.3 F (37.4 C), temperature source Oral, resp. rate 17, height '5\' 2"'$  (1.575 m), weight 69.2 kg, last menstrual period 08/13/2022.  Physical Exam Constitutional:      Appearance: She is well-developed.  HENT:     Head: Normocephalic.  Eyes:     Pupils: Pupils are equal, round, and reactive to light.  Cardiovascular:     Rate and Rhythm: Regular rhythm.  Pulmonary:     Effort: Pulmonary effort is normal. No respiratory distress.  Genitourinary:    Vagina: No bleeding.     Comments:      Musculoskeletal:        General: Normal range of motion.     Cervical back: Normal range of motion.  Skin:    General: Skin is warm and dry.  Neurological:     Mental Status: She is  alert and oriented to person, place, and time.  Psychiatric:        Mood and Affect: Mood normal.        Behavior: Behavior normal.    Results for orders placed or performed during the hospital encounter of 09/05/22 (from the past 24 hour(s))  Pregnancy, urine POC     Status: Abnormal   Collection Time: 09/05/22  9:52 PM  Result Value Ref Range   Preg Test, Ur POSITIVE (A) NEGATIVE    MAU Course  Procedures  MDM Reviewed patient's OB history as the computer only had one prior pregnancy documented. She had a pregnancy termination about 5-6 years ago. She had a SAB in 12/2021 and then one other SAB and she is unsure the year that occurred. GsPs updated in epic.   Assessment and Plan   1. Nausea/vomiting in pregnancy   2. [redacted] weeks gestation of pregnancy    DC home in stable condition  1st Trimester precautions  Bleeding precautions RX: phenergan PRN #30  Return to MAU as needed Outpatient [place] ordered for 3 weeks from now when patient should be between 6-7 weeks.  List of OB providers given for patient to call around for prenatal  care   Follow-up Information     [facility].   Specialty: Radiology Why: They will call you with an appointment Contact information: [address], [place] [phone]               Madyson Lukach DNP, CNM  09/05/22  10:23 PM

## 2022-09-21 ENCOUNTER — Ambulatory Visit (INDEPENDENT_AMBULATORY_CARE_PROVIDER_SITE_OTHER): Payer: Medicaid Other

## 2022-09-21 ENCOUNTER — Ambulatory Visit
Admission: RE | Admit: 2022-09-21 | Discharge: 2022-09-21 | Disposition: A | Discharge status: Home / Self Care | Payer: Medicaid Other | Source: Ambulatory Visit | Attending: Advanced Practice Midwife | Admitting: Advanced Practice Midwife

## 2022-09-21 VITALS — BP 106/70 | HR 85 | Wt 149.4 lb

## 2022-09-21 DIAGNOSIS — Z712 Person consulting for explanation of examination or test findings: Secondary | ICD-10-CM

## 2022-09-21 DIAGNOSIS — O219 Vomiting of pregnancy, unspecified: Secondary | ICD-10-CM

## 2022-09-21 DIAGNOSIS — Z3A01 Less than 8 weeks gestation of pregnancy: Secondary | ICD-10-CM

## 2022-09-21 NOTE — Patient Instructions (Signed)

## 2022-09-21 NOTE — Progress Notes (Signed)
Ultrasound Results  Here today for results following viability [place]. Results reviewed with Damita Dunnings, MD who finds single, living IUP. Small subchorionic hemorrhage measuring 1.9 cm. Reviewed results, including dating with patient:   EDD: 05/19/23 5w 5d today  Reviewed medications and allergies with patient; list of medications safe to take during pregnancy given.  Recommended pt begin prenatal vitamin and schedule prenatal care. Reviewed return precautions and availability of MAU.  Annabell Howells, RN 09/21/2022  2:09 PM

## 2022-10-28 ENCOUNTER — Telehealth (INDEPENDENT_AMBULATORY_CARE_PROVIDER_SITE_OTHER): Payer: Medicaid Other

## 2022-10-28 DIAGNOSIS — Z3689 Encounter for other specified antenatal screening: Secondary | ICD-10-CM

## 2022-10-28 DIAGNOSIS — Z3490 Encounter for supervision of normal pregnancy, unspecified, unspecified trimester: Secondary | ICD-10-CM | POA: Insufficient documentation

## 2022-10-28 DIAGNOSIS — Z3491 Encounter for supervision of normal pregnancy, unspecified, first trimester: Secondary | ICD-10-CM

## 2022-10-28 MED ORDER — BLOOD PRESSURE KIT DEVI: 1.0000 | 0 refills | Status: DC | PRN

## 2022-10-28 NOTE — Progress Notes (Addendum)
New OB Intake  I connected with Becky Strickland  on 10/28/22 at  2:15 PM EDT by MyChart Video Visit and verified that I am speaking with the correct person using two identifiers. Nurse is located at [facility] and pt is located at home.  I discussed the limitations, risks, security and privacy concerns of performing an evaluation and management service by telephone and the availability of in person appointments. I also discussed with the patient that there may be a patient responsible charge related to this service. The patient expressed understanding and agreed to proceed.  I explained I am completing New OB Intake today. We discussed EDD of 05/20/2023 that is based on LMP of 08/13/2022. Pt is G4/P0. I reviewed her allergies, medications, Medical/Surgical/OB history, and appropriate screenings. I informed her of [facility] services. [facility] information placed in AVS. Based on history, this is a low risk pregnancy.  Patient Active Problem List   Diagnosis Date Noted   Encounter for pregnancy test 12/31/2021    Concerns addressed today  Delivery Plans Plans to deliver at [facility]. Patient given information for [facility] website for more information [facility].   MyChart/Babyscripts MyChart access verified. I explained pt will have some visits in office and some virtually. Babyscripts instructions given and order placed. Patient verifies receipt of registration text/e-mail. Account successfully created and app downloaded.  Blood Pressure Cuff/Weight Scale Blood pressure cuff ordered for patient to pick-up from [employer]. Explained after first prenatal appt pt will check weekly and document in Babyscripts.  Anatomy US Explained first scheduled [place] will be around 19 weeks. Anatomy US scheduled for 12/24/2022 at 2:15pm. Pt notified to arrive at 2:30pm.  Labs Discussed Avelina Laine genetic screening with patient. Would like both Panorama and Horizon drawn at new OB visit. Routine  prenatal labs needed.  COVID Vaccine Patient has had COVID vaccine.   Is patient a CenteringPregnancy candidate?  Declined due to Declined to say    Is patient a Mom+Baby Combined Care candidate?  Not a candidate   If accepted, Mom+Baby staff notified  Social Determinants of Health Food Insecurity: Patient denies food insecurity. WIC Referral: Patient is interested in referral to [facility].  Transportation: Patient denies transportation needs. Childcare: Discussed no children allowed at ultrasound appointments. Offered childcare services; patient declines childcare services at this time.  Interested in [place]? If yes, send referral and doula dot phrase.   First visit review I reviewed new OB appt with patient. I explained they will have a provider visit that includes new ob labs and listening to baby heartbeat. Explained pt will be seen by Dr. Camelia PhenesRobb Matar at first visit; encounter routed to appropriate provider. Explained that patient will be seen by pregnancy navigator following visit with provider.   Lowry Bowl, CMA 10/28/2022  12:55 PM     Discussed with patient previous pregnancy. Patient had abortion listen in ob history. Asked patient about it to confirm. Patient stated no she never had and abortion. IAB removed from patients chart.

## 2022-10-28 NOTE — Patient Instructions (Signed)
Safe Medications in Pregnancy   Acne:  Benzoyl Peroxide  Salicylic Acid   Backache/Headache:  Tylenol: 2 regular strength every 4 hours OR               2 Extra strength every 6 hours   Colds/Coughs/Allergies:  Benadryl (alcohol free) 25 mg every 6 hours as needed  Breath right strips  Claritin  Cepacol throat lozenges  Chloraseptic throat spray  Cold-Eeze- up to three times per day  Cough drops, alcohol free  Flonase (by prescription only)  Guaifenesin  Mucinex  Robitussin DM (plain only, alcohol free)  Saline nasal spray/drops  Sudafed (pseudoephedrine) & Actifed * use only after [redacted] weeks gestation and if you do not have high blood pressure  Tylenol  Vicks Vaporub  Zinc lozenges  Zyrtec   Constipation:  Colace  Ducolax suppositories  Fleet enema  Glycerin suppositories  Metamucil  Milk of magnesia  Miralax  Senokot  Smooth move tea   Diarrhea:  Kaopectate  Imodium A-D   *NO pepto Bismol   Hemorrhoids:  Anusol  Anusol HC  Preparation H  Tucks   Indigestion:  Tums  Maalox  Mylanta  Zantac  Pepcid   Insomnia:  Benadryl (alcohol free) 25mg every 6 hours as needed  Tylenol PM  Unisom, no Gelcaps   Leg Cramps:  Tums  MagGel   Nausea/Vomiting:  Bonine  Dramamine  Emetrol  Ginger extract  Sea bands  Meclizine  Nausea medication to take during pregnancy:  Unisom (doxylamine succinate 25 mg tablets) Take one tablet daily at bedtime. If symptoms are not adequately controlled, the dose can be increased to a maximum recommended dose of two tablets daily (1/2 tablet in the morning, 1/2 tablet mid-afternoon and one at bedtime).  Vitamin B6 100mg tablets. Take one tablet twice a day (up to 200 mg per day).   Skin Rashes:  Aveeno products  Benadryl cream or 25mg every 6 hours as needed  Calamine Lotion  1% cortisone cream   Yeast infection:  Gyne-lotrimin 7  Monistat 7    **If taking multiple medications, please check labels to avoid  duplicating the same active ingredients  **take medication as directed on the label  ** Do not exceed 4000 mg of tylenol in 24 hours  **Do not take medications that contain aspirin or ibuprofen            [place] Pediatric Providers  [place] ([address]) [facility] Brown, MD; Chambliss, MD; Eniola, MD; Hensel, MD; McDiarmid, MD; McIntyer, MD [address] [place], [place] [address] [phone] Mon-Fri 8:30-12:30, 1:30-5:00  Providers come to see babies during newborn hospitalization Only accepting infants of Mother's who are seen at Family Medicine Center or have siblings seen at   [facility] Center Medicaid - Yes; Tricare - Yes   [facility] Mulberry, MD [address] [place], [place] [address] [phone] Mon, Tue, Thur, Fri 8:30-5:00, Wed 10:00-7:00 (closed 1-2pm daily for lunch) Takes [place] residents with no insurance.  [place] [facility] only with Medicaid/insurance; Tricare - no  [facility] Ben-Davies, MD; Brown, MD; Chandler, MD; Ettefagh, MD; Grant, MD; Hanvey, MD; Herrin, MD; Jones,  MD; Lester, MD; McCormick, MD; McQueen, MD; Simha, MD; Stanley, MD; Stryffeler, NP [address], [place], [place] [address] [phone] Mon, Tue, Thur, Fri 8:30-5:30, Wed 9:30-5:30, Sat 8:30-12:30 Only accepting infants of first-time parents or siblings of current   patients Hospital discharge coordinator will make follow-up appointment Medicaid - yes; Tricare - yes  [place] ([address]) [facility] Cox, MD; Davis, MD; Dovico, MD; Ettefaugh, MD; Lowe, MD; Nation, MD; Slimp, MD; Sumner, MD; Williams, MD [address], [place], [place] [address] [phone] Mon-Fri 8:30-5:00, closed for lunch 12:30-1:30; Sat-Sun 10:00-1:00 Accepting Newborns with commercial insurance only, must call prior to  delivery to be accepted into  practice.  Medicaid - no, Tricare - yes   [facility] [address] [place], [place] [address] [phone] or [phone] Mon to Fri 8am to 10pm, Sat 8am to 1pm (virtual only on weekends) Only accepts Medicaid Healthy Blue pts  [facility]  Artis, MD; Coccaro, MD; [place] Gardner, MD; Netherton, NP; Roper, MD; Wilmot, PA-C; Skinner, MD [address] [place], [place] [address] [phone] Mon-Fri 8:30-5:30 Medicaid - yes, Tricare - yes  [place] ([address]) [facility] Warner, MD [address], [place], [place] [address] [phone] Mon, Tues, Wed Fri 8:30-5:00, Sat 8:30-12:00, Closed Thursdays Accepting siblings of established patients and first time mom's if you call prenatally Medicaid- yes; Tricare - yes  [facility] Becker, PA; Hagler, MD; Quinn, PA-C; Scifres, PA; Sun, MD; Swayne, MD;  [address], [place], [place] [address] [phone] Mon-Fri 8:30-5:00, closed for lunch 1-2 Only accepting newborns of established patients Medicaid- no; Tricare - yes  [place] ([address]) [facility] Timberlake, MD; [address], [place], [place] [address] [phone] Mon-Fri 8:00-5:00 Medicaid - No; Tricare - Yes  [facility] [employer]  Brake, NP; Wharton, PA [address], [place], [place] [address] [phone] Mon-Fri 8:00-5:00 Medicaid - No, Tricare - Yes  [facility] Gay, MD; Quinlan, MD; Blatt, DNP [address] [place], [place] [address] [phone]  Mon-Fri 8:00-5:00 Medicaid - No; Tricare - Yes  [facility] [address] [place], [place] [address] [phone] Mon-Fri 8:30-5:00 (lunch 12:00-1:00) Medicaid -Yes; Tricare - Yes  [facility] Jordan, MD [address],  [place], [place] [address] [phone] Mon-Fri 8:00-5:00 Seeing newborns of current patients only. No new patients Medicaid - No, Tricare - yes  [facility] Parker, MD [address] [place], [place] [address] [phone] Mon-Fri 8:00-5:00 Medicaid -yes as secondary coverage only; Tricare - yes  [facility] Brecken, PA; Christy, NP; Dees, MD; DeClaire, MD; DeWeese, MD; Hodge, PA; Smoot, NP; Summer, MD; Vapne, MD [address] [place], [place] [address] [phone] Mon-Fri 8:30-5:00, Sat 9:00-11:00 Accepts commercial insurance ONLY. Offers free prenatal information sessions for families. Medicaid - No, Tricare - Call first  [facility] Bouska, MD; Gordon, PA; Jeffery, PA; Weber, PA [address] [place] [address] [phone] Mon-Fri 7:30-5:30 Medicaid - Yes; Tricare - yes  [place] ([address] & [address])  [facility] Reese, MD [address] [place], [place] [address] [phone] Mon-Thur 8:00-6:00, closed for lunch 12-2, closed Fridays Medicaid - yes; Tricare - no  [facility] Anderson, NP; Badger, MD; Beal, PA; Spencer, PA [address], , [place] [address] [phone] Mon-Fri 7:30-4:30 Medicaid - yes, Tricare - yes  [facility]  Agbuya, MD; Klett, NP; Romgoolam, MD; Rothstein, NP [address], ,  [address] [phone] Mon-Fri 8:30-5:00, closed for lunch 1-2, Sat 8:30-12:00 - sick visits only Providers come to see   babies at [facility] Only accepting newborns of siblings and first time parents ONLY if who have met with office prior to delivery Medicaid -Yes; Tricare - yes  [facility] [place]  Golden, DO; Friddle, NP; Wallace, MD; Wood, MD:  [address], [place], [place] [address] [phone] Mon- Fri 8:00-5:00, Sat 9:00-12:00 - sick  visits only Accepting siblings of established patients and first time mom/baby Medicaid - Yes; Tricare - yes Patients must have vaccinations (baby vaccines)  [place] ([address] & [address])  [facility] [address] [place], [place] [address] [phone] Mon-Fri 8:00-5:00 Medicaid - no; Tricare - yes  [facility] Briscoe, MD; Schmidt, PA; Moreira, PA [address], [place], [place] [address] [phone] Mon-Fri 8:00-5:00 Medicaid- yes; Tricare - yes  [facility] Boyd, MD; Jones, NP; Osborn, PA [address], [place], [place] [address] [phone] Mon-Fri 8:00-5:00 Medicaid - Yes; Tricare - yes  [place] ([address])  [facility] Atkinson, PA; Calderon, PA; Cummings, MD; Dillard, MD; Henrish, NP; Isenhour, DO; Martin, PA; Olson, MD; Ott, MD; Phillips, MD; Valente, PA; VanDeven, PA; Yonjof, NP [address], [place], [place] [address] [phone] Mon-Fri 8:30-5:00, Sat 9:00-12:00 - sick only Please register online [email or web address] then schedule online or call office Medicaid-Yes; Tricare -yes  [facility]  Dabrusco, MD; Dial, MD; Bearden, MD; Fleenor, NP; Goolsby, PA; Tonuzi, MD; Turner, NP; West, MD [address], [place], [place] [address] [phone] Mon-Fri 8:00-5:30, Sat&Sun by appointment (phones open at 8:30) Medicaid - Yes; Tricare - yes  [place] ([address]) [facility] Allen, CPNP; Bates, MD; Gordon, MD; Mills, NP; Weinshilboum, DO [address], [place], [place] [address] [phone] M-F 8:00 - 5:15, Sat/Sun 9-12 sick visits only Medicaid - No; Tricare - yes  [facility] [place] [facility]  Brown, PA-C; Cowen, PA-C; Dennis, DO; Fuster, PA-C; Martin, PA-C; Shelton,  PA-C; Spry, MD [address] [place], [place] [address] [phone] Mon-Thur 8:00-7:00, Fri 8:00-5:00 Accepting Medicaid for 13 and under only   [facility] [place] [facility] [place] Hayes, FNP; List, FNP; Moran, MD; Pitonzo, PA-C; Scholer, MD; Spangle, FNP; Nzenwa, FNP; Jasper, MD; Moran, MD [address] [place], [place] [address] [phone] Mon-Fri 8:30-5:30 Medicaid - Yes; Tricare - yes  [facility] [address]  Kelly, CPNP; Logan, MD; Poth, MD; Ramadoss, MD; Staton, NP [address], [place], [place] [address] [phone] Mon-Thur 8:00-5:30, Fri 8:00-5:00, Sat 9:00-12:00 Medicaid - yes, Tricare - yes  [place] ([address])  [facility] [place] Masneri, DO; Meyers, MD; Nelson, PA [address], [place], [place] [address] [phone] Mon-Fri 8:00-5:00, closed for lunch 12-1 Medicaid - No; Tricare - yes  [facility] [place] McGowen, MD [address], [place], [place] [address] [phone] Mon-Fri 8:00-5:00 Medicaid - No; Tricare - yes  [facility] [place] MacDonald, MD; Nayak, MD; Kearns, MD; Jones, MD [address] [place] [address], [place],  [address] [phone] Mon-Fri 8:00-5:00 Medicaid- Yes; Tricare - yes  [place] ([address])  [facility] [place] Martin, PA-C; Tabori, MD [address], [place],  [address] [phone] Mon-Fri 8:00-5:00 Medicaid - No; Tricare - yes  [facility] [place]  Margin - CPNP [address], [place], [place] [address] [phone] Mon-Weds 8:00-6:00, Thurs-Fri 8:00-5:00, Sat 9:00-12:00 Medicaid - yes; Tricare - yes   [facility] [place] Aubuchon, MD; Brandon, PA [address] [place], [place] [address] [phone] Mon-Fri 8:00-5:00 Medicaid - yes; Tricare - yes  [place]  Pediatric Providers  [facility] [address], [place], [place] [address] [phone] M, Thur: 8am -8pm, Tues, Weds: 8am - 5pm; Fri: 8-1 Medicaid - Yes; Tricare - yes  [facility] Mertz, MD; Johnson, MD; Wells, MD; Downs, PA; Hockenberger, PA [address], [place], [place] [address] [phone] M-F 8:30 - 5:00 Medicaid - Call office; Tricare -yes  [facility] Bonney, MD; Page, MD, Minter, MD; Mueller, PNP; Thomason, NP [address], [place], [place] [address] [phone] M-F 8:30 - 5:00, Sat/Sun 8:30 - 12:30 (sick visits) Medicaid - Call office; Tricare -yes  [facility] Lewis, MD; Shaub, PNP; Boylston, MD; Quaile, PA; Nonato, NP; Landon, CPNP [address], [place], [place] [address] [phone] M-F 8:30 - 5:00 Medicaid - Call office; Tricare - yes  [facility] [place] Cline, MD; Dvergsten, MD; Flores, MD; Kawatu, MD; Nogo, MD [address], [place], [place] [address] [phone] M-Thur: 8:00 - 5:00; Fri: 8:00 - 4:00 Medicaid - yes; Tricare - yes  [facility] [address] [place], [place], [place] [address] [phone] M-F: 8:30- 5:00, closed for lunch 12:30 - 1:00 Medicaid - yes; Tricare -yes  [facility] [place] [address], [place], [place] [address] [phone] M-F 8:00 - 5:00 Medicaid - yes; Tricare - yes  [facility] Johnson, DO; Rumball, DO; Wicker, NP [address] [place], [place], [place] [address] [phone] M-F 8:00 - 5:00, Closed 12-1 for lunch Medicaid - Call; Tricare - yes  [facility] Stein, MD [address], , [place] [address] [phone] M-F: 8:00-5:00, Sat: 8:00 - noon Medicaid - call; Tricare -yes  [place] Pediatric Providers  [facility] Collins, FNP-C [address], [place], [place] [address] [phone] M-W: 8:00-5:00, Thur: 8:00 -  7:00, Fri: 8:00 - noon Medicaid - yes; Tricare - yes  [facility] [place] Adams, FNP [address], [place], [place] [address] [phone] M-F 8:00 - 5:00, Closed for lunch 12-1 Medicaid - yes; Tricare - yes  [place] Pediatric Providers  [facility] [place] Smith, FNP, Melvin, MD, Fay, FNP-C [address] [place] [address] [place], [place], [place] [address] [phone] M-T 8:00-5:00, Wed-Fri 7:00-6:00 Medicaid - Yes; Tricare -yes  [facility] [place] Civiletti, DO; [address], [place], [place] [address] [phone] M-F 8:00 - 5:00, closed for lunch 12-1 Medicaid - Yes; Tricare - yes  [facility]  Barnes, MD; Bergdolt, MD; Caulfield, MD; Emrich, MD; Fiscus, MD; Hoppens, MD; Kylstra, MD, McPherson, MD; Todd, MD; Prestwood, MD; Waters, MD; Wood, MD [address], [place], [place] [address] [phone] M-F 8:00-5:00 Medicaid - yes; Tricare - yes  [facility] Cheema, MD (speaks Punjabi and Hindi) [address] [place],  [address] [phone] M-F: 8:30 - 5:00, closed 12:30 - 1 for lunch Medicaid - Yes; Tricare -yes  [place] Pediatric Providers  [facility] Loda, MD; Timberlake, MD; Burke,   MD [address], [place], [place] [address] [phone] M-Th: 8:00 - 5:30, Fri: 8:00 - 12:00 Medicaid - yes; Tricare - yes  [facility] [place] Lookabill, NP; Meier, MD; Daffron, MD [address], [place], [place] [address] [phone] M-F: 8:00 - 5:00 Medicaid - yes; Tricare - yes  [facility] Busse, NP; Bowman, NP; Baune, NP; Entwistle, MD; Williams, MD, Huffman, NP, Ferguson, MD; Patel, DO [address], [place], [place] [address] [phone] M-F: 8:30 - 5:30p Medicaid - yes; Tricare - yes Other locations available as well  [place] [facility] Rajan, MD; Wilson, MD; Morgan, PA-C, Domenech, PA-C; Myers, PA-C [address], [place], [place] [address] [phone] M-W: 8:00am - 7:00pm, Thurs: 8:00am - 8:00pm; Fri: 8:00am - 5:00pm, closed daily from 12-1 for lunch Medicaid - yes; Tricare - yes  [place] Pediatric Providers  [facility] [place] Adams, MD; Crystal, FNP; Hadley, MD; Stokes, MD; Johnson, PNP; Brady, PA-C; West, PNP; Gardner, MD;  [address], [place], [place] [address] [phone] M - Fri: 8am - 5pm, Sat 9-noon Medicaid - Yes; Tricare -yes  [facility] [place] Nayak, MD; Jones, FNP; McDonald, MD; Kearns, MD [address] [place] [address], [place], [place] [phone] M-F 8:00 - 5:00 Medicaid - call; Tricare - yes  [facility] [place] Bell, MD; Emory, PNP; Pinder, MD; Anderson, MD; Light, PA-C; Johnson, MD; Latta, MD; Saul, PNP; Rainey, MD; Clifford, MD; McClung, MD [address], [place], [place] [address] [phone] M-F 8:00am - 5:00pm; Sat. 9:00 - 11:00 Medicaid - yes; Tricare - yes  [facility] [place] Soldato-Couture, MD [address], [place], [place] [address] [phone] M-F 8:00 - 5:00 Medicaid - [place] Medicaid only; Tricare - yes  [facility] [place] Walker, MD; Davis, PNP; Ajizian, MD [address] [place] [address], [place], [place] [address] [phone] M-F 8:00 - 5:00 Medicaid - yes; Tricare - yes  [facility] [place] Barry, MD; Brown, MD, [place], MD, Hazek, MD; Hoyle, MD; Smith, MD; [address], [place], [place] [address] [phone] M-F: 8-5 Medicaid - yes; Tricare - yes  [facility] [place] Brady, Md; Dowlen, MD; [address] [place] [address], [place], [place] [address] [phone] M-F 8-5 Medicaid - yes; Tricare - yes  [facility] Kearns, MD; Nayak, MD; Soldato-Courture, MD; Pellam-Palmer, DNP;  Herring, PNP [address], [place], [place] [address] [phone] M-F 8-5 Medicaid - yes; Tricare - yes  [facility] Weathers, MD; Merritt, PA-C; Davis-PA-C; Warnimont, MD [address], [place], [place] [address] [phone] M-F 7am - 5 pm Medicaid - call; Tricare - yes  [facility] Hill, PNP; Erickson, MD; Robinson, MD [address], [place], [place] [address] [phone] M-F 8-5 Medicaid - yes; Tricare - yes  [facility] Kribbs, MD; Warner, MD; Williams, FNP; Brooks, FNP; Boles, FNP; Romblad, PA-C; [place] - FNP [address], [place], [place] [address] [phone] M-F 8-5 Medicaid- yes; Tricare - yes  [facility] [place] [place] Yoder, MD; Verenes, MD; Armentrout, MD; Stewart, MD; Beasley, CPNP; Ford, MD; Erickson, MD; Rice, MD [address], [place], [place] [address] [phone] M-F: 8-5, Sat: 9-4, Sun 9-12 Medicaid - yes; Tricare - yes  [facility] Little, PNP; Davis, PNP [address], [place],   [place] [address] [phone] M-F 8 - 5, closed 12-1 for lunch Medicaid - yes; Tricare - yes  [facility] Friesen, MD; Cnegia, MD; Rice, MD; Patel, DO [address], [place], [place] [address] [phone] M-F 8- 5:30 Medicaid - yes; Tricare - yes  [facility] [place] Zvolensky, MD; Ray, MD; Haas, MD [address] [address], [place], [place] [address] [phone] M: 8-7; Tues-Fri: 8-5; Sat: 9-12 Medicaid - yes; Tricare - yes  [facility] [place] Heinrich, MD; Meyer, MD; Clark, MD; Rhyne, MD; Aubuchon, MD [address], [place], [place] [address] [phone] M: 8-7; Tues-Fri: 8-5; Sat: 8:30-12:30 Medicaid - yes;  Tricare - yes  [facility] [place] Bista, MD; Dillard, PA [address], [place], [place] [address] [phone] Mon-Fri: 8-5 Medicaid - yes; Tricare - yes  [facility] [place] Beasley, CPNP; Mahle, CPNP; Rice, MD; Duffy, MD; Culler, MD; [address], [place], [place] [address] [phone] M-F: 8-5, closed 1-2 for lunch Medicaid - yes; Tricare - yes  [facility] Rickman, PA; Mounce, NP; Smith, MD; Jordan, CPNP; Darty, PA; Ball, MD; Wallace, MD [address] [place] [address], [place], [place] [address] [phone] M-Thurs: 8-7; Fri: 8-6; Sat: 9-12; Sun 2-4 Medicaid - yes; Tricare - yes  [facility] Brown, MD; Shin, MD; Goodman, DNP, FNP; Sebesta, DO; [address], [place], [place] [address] [phone] M-F: 8-5 Medicaid - yes; Tricare - yes  [place] Pediatric Providers  [facility] Dough, MD; Welsh, NP [address], [place], [place] [address] [phone] M - Fri: 8am - 5pm, closed for lunch 12-1 Medicaid - Yes; Tricare - yes  [facility] Manandhar, MD; Riley, MD; Sanger, DO; Vinocur, MD;Hall, PA; Walsh, PA; Campbell, NP [address], [place] [address] [phone] M-F 8:00 - 5:00, Sat 8:00 - 11:30 Medicaid - yes; Tricare - yes  [facility] Khan, MD; Redding, MD, Street, MD, Holt, MD, Burgart, MD; Rhyne, NP; Dickinson, PA;  [address], [place], [place] [address] [phone] M-F 8:10am - 5:00pm Medicaid - yes; Tricare - yes  [facility] Connors, MD; Kime, NP [address], [place], [place] [address] [phone] M-F 8:00 - 5:00 Medicaid - [place] Medicaid only; Tricare - yes  [facility] [place] Whyte, MD; Fox, NP [address], [place], [place] [address] [phone] M-F 8:00 - 5:00; Closed for lunch 12 - 1:00 Medicaid - yes; Tricare - yes  [facility] Penner, MD; Wilburn, FNP [address], [place], [place] [address] [phone] Mon 9-5; Tues/Wed 10-5; Thurs 8:30-5; Fri: 8-12:30 Medicaid - yes; Tricare - yes  [place] Pediatric Providers  [facility]  Golding, MD; Jackson, PA-C [address], [place], [place] [address] [phone] phone [phone] fax M-F 7:15 - 4:30 Medicaid - yes; Tricare - yes  [facility] Gosrani, MD; Meccariello, DO [address] [place], [place] [address] [phone] M-Fri: 8:30 - 5:00, closed for lunch everyday noon - 1pm Medicaid - Yes; Tricare - yes  [facility] Burdine, MD; Daniel, MD; Howard, MD; Sasser, MD; Boles, PA; Boyd, PA-C; Carroll, PA; McGee, PA; Skillman,   PA; Wilson, PA [address] [place], [place] [address] [phone] M-Thurs: 7:30am - 7:00pm; Friday 7:30am - 4pm; Sat: 8:00 - 1:00 Medicaid - Yes; Tricare - yes  [facility] [place] Akhbari, MD; Law, MD; Qayumi, MD; Salvador, DO [address], [place], [place] [address] [phone] M-Thur: 8:00 - 5:00, Fri: 8:00 - Noon Medicaid - yes; Tricare - yes No [place] [employer]  [facility] [place] [facility] Dettinger, MD; Gottschalk, DO; Hawks, NP; Martin, NP; Morgan, NP; Milian, NP; Rakes, NP; Stacks, MD; Webster, PA [address], [place], [place] [address] [phone] M-F 8:00 - 5:00 Medicaid - yes; Tricare - yes  [facility] Collins, FNP-C; Bucio, FNP-C [address], [place],  [address] [phone] M, W, R 8:00-5:00, Tues: 8:00am - 7:00pm; Fri 8:00 - noon Medicaid - Yes; Tricare - yes  [facility] Khan, MD [address] [place]  [address] [place],  [address] [phone]  M-Thurs 8:30-5:30, Fri: 8:30-12:30pm Medicaid - Yes; Tricare - N  

## 2022-11-02 ENCOUNTER — Other Ambulatory Visit (HOSPITAL_COMMUNITY)
Admission: RE | Admit: 2022-11-02 | Discharge: 2022-11-02 | Disposition: A | Discharge status: Home / Self Care | Payer: Medicaid Other | Source: Ambulatory Visit | Attending: Family Medicine | Admitting: Family Medicine

## 2022-11-02 ENCOUNTER — Other Ambulatory Visit: Payer: Self-pay

## 2022-11-02 ENCOUNTER — Ambulatory Visit: Payer: Medicaid Other | Admitting: Family Medicine

## 2022-11-02 VITALS — BP 101/68 | HR 96 | Wt 148.4 lb

## 2022-11-02 DIAGNOSIS — B379 Candidiasis, unspecified: Secondary | ICD-10-CM | POA: Diagnosis present

## 2022-11-02 DIAGNOSIS — Z3A12 12 weeks gestation of pregnancy: Secondary | ICD-10-CM | POA: Diagnosis not present

## 2022-11-02 DIAGNOSIS — Z3492 Encounter for supervision of normal pregnancy, unspecified, second trimester: Secondary | ICD-10-CM

## 2022-11-02 DIAGNOSIS — Z3491 Encounter for supervision of normal pregnancy, unspecified, first trimester: Secondary | ICD-10-CM | POA: Diagnosis not present

## 2022-11-02 MED ORDER — ASPIRIN 81 MG PO TBEC: 81.0000 mg | DELAYED_RELEASE_TABLET | Freq: Every day | ORAL | 12 refills | Status: DC

## 2022-11-02 NOTE — Patient Instructions (Signed)
Prenatal Care WHAT IS PRENATAL CARE? Prenatal care is the process of caring for a pregnant woman before she gives birth. Prenatal care makes sure that she and her baby remain as healthy as possible throughout pregnancy. Prenatal care may be provided by a midwife, family practice health care provider, or a childbirth and pregnancy specialist (obstetrician). Prenatal care may include physical examinations, testing, treatments, and education on nutrition, lifestyle, and social support services. WHY IS PRENATAL CARE SO IMPORTANT? Early and consistent prenatal care increases the chance that you and your baby will remain healthy throughout your pregnancy. This type of care also decreases a baby's risk of being born too early (prematurely), or being born smaller than expected (small for gestational age). Any underlying medical conditions you may have that could pose a risk during your pregnancy are discussed during prenatal care visits. You will also be monitored regularly for any new conditions that may arise during your pregnancy so they can be treated quickly and effectively. WHAT HAPPENS DURING PRENATAL CARE VISITS? Prenatal care visits may include the following: Discussion Tell your health care provider about any new signs or symptoms you have experienced since your last visit. These might include: Nausea or vomiting. Increased or decreased level of energy. Difficulty sleeping. Back or leg pain. Weight changes. Frequent urination. Shortness of breath with physical activity. Changes in your skin, such as the development of a rash or itchiness. Vaginal discharge or bleeding. Feelings of excitement or nervousness. Changes in your baby's movements.  You may want to write down any questions or topics you want to discuss with your health care provider and bring them with you to your appointment. Examination During your first prenatal care visit, you will likely have a complete physical exam. Your  health care provider will often examine your vagina, cervix, and the position of your uterus, as well as check your heart, lungs, and other body systems. As your pregnancy progresses, your health care provider will measure the size of your uterus and your baby's position inside your uterus. He or she may also examine you for early signs of labor. Your prenatal visits may also include checking your blood pressure and, after about 10-12 weeks of pregnancy, listening to your baby's heartbeat. Testing Regular testing often includes: Urinalysis. This checks your urine for glucose, protein, or signs of infection. Blood count. This checks the levels of white and red blood cells in your body. Tests for sexually transmitted infections (STIs). Testing for STIs at the beginning of pregnancy is routinely done and is required in many states. Antibody testing. You will be checked to see if you are immune to certain illnesses, such as rubella, that can affect a developing fetus. Glucose screen. Around 24-28 weeks of pregnancy, your blood glucose level will be checked for signs of gestational diabetes. Follow-up tests may be recommended. Group B strep. This is a bacteria that is commonly found inside a woman's vagina. This test will inform your health care provider if you need an antibiotic to reduce the amount of this bacteria in your body prior to labor and childbirth. Ultrasound. Many pregnant women undergo an ultrasound screening around 18-20 weeks of pregnancy to evaluate the health of the fetus and check for any developmental abnormalities. HIV (human immunodeficiency virus) testing. Early in your pregnancy, you will be screened for HIV. If you are at high risk for HIV, this test may be repeated during your third trimester of pregnancy.  You may be offered other testing based on your age,  personal or family medical history, or other factors. HOW OFTEN SHOULD I PLAN TO SEE MY HEALTH CARE PROVIDER FOR PRENATAL  CARE? Your prenatal care check-up schedule depends on any medical conditions you have before, or develop during, your pregnancy. If you do not have any underlying medical conditions, you will likely be seen for checkups: Monthly, during the first 6 months of pregnancy. Twice a month during months 7 and 8 of pregnancy. Weekly starting in the 9th month of pregnancy and until delivery.  If you develop signs of early labor or other concerning signs or symptoms, you may need to see your health care provider more often. Ask your health care provider what prenatal care schedule is best for you. WHAT CAN I DO TO KEEP MYSELF AND MY BABY AS HEALTHY AS POSSIBLE DURING MY PREGNANCY? Take a prenatal vitamin containing 400 micrograms (0.4 mg) of folic acid every day. Your health care provider may also ask you to take additional vitamins such as iodine, vitamin D, iron, copper, and zinc. Take 1500-2000 mg of calcium daily starting at your 20th week of pregnancy until you deliver your baby. Make sure you are up to date on your vaccinations. Unless directed otherwise by your health care provider: You should receive a tetanus, diphtheria, and pertussis (Tdap) vaccination between the 27th and 36th week of your pregnancy, regardless of when your last Tdap immunization occurred. This helps protect your baby from whooping cough (pertussis) after he or she is born. You should receive an annual inactivated influenza vaccine (IIV) to help protect you and your baby from influenza. This can be done at any point during your pregnancy. Eat a well-rounded diet that includes: Fresh fruits and vegetables. Lean proteins. Calcium-rich foods such as milk, yogurt, hard cheeses, and dark, leafy greens. Whole grain breads. Do not eat seafood high in mercury, including: Swordfish. Tilefish. Shark. King mackerel. More than 6 oz tuna per week. Do not eat: Raw or undercooked meats or eggs. Unpasteurized foods, such as soft cheeses  (brie, blue, or feta), juices, and milks. Lunch meats. Hot dogs that have not been heated until they are steaming. Drink enough water to keep your urine clear or pale yellow. For many women, this may be 10 or more 8 oz glasses of water each day. Keeping yourself hydrated helps deliver nutrients to your baby and may prevent the start of pre-term uterine contractions. Do not use any tobacco products including cigarettes, chewing tobacco, or electronic cigarettes. If you need help quitting, ask your health care provider. Do not drink beverages containing alcohol. No safe level of alcohol consumption during pregnancy has been determined. Do not use any illegal drugs. These can harm your developing baby or cause a miscarriage. Ask your health care provider or pharmacist before taking any prescription or over-the-counter medicines, herbs, or supplements. Limit your caffeine intake to no more than 200 mg per day. Exercise. Unless told otherwise by your health care provider, try to get 30 minutes of moderate exercise most days of the week. Do not  do high-impact activities, contact sports, or activities with a high risk of falling, such as horseback riding or downhill skiing. Get plenty of rest. Avoid anything that raises your body temperature, such as hot tubs and saunas. If you own a cat, do not empty its litter box. Bacteria contained in cat feces can cause an infection called toxoplasmosis. This can result in serious harm to the fetus. Stay away from chemicals such as insecticides, lead, mercury, and cleaning or  paint products that contain solvents. Do not have any X-rays taken unless medically necessary. Take a childbirth and breastfeeding preparation class. Ask your health care provider if you need a referral or recommendation.  This information is not intended to replace advice given to you by your health care provider. Make sure you discuss any questions you have with your health care  provider. Document Released: 07/02/2003 Document Revised: 12/02/2015 Document Reviewed: 09/13/2013 Elsevier Interactive Patient Education  2017 Elsevier Inc.   Childbirth Education Options: [facility] Classes:  Childbirth education classes can help you get ready for a positive parenting experience. You can also meet other expectant parents and get free stuff for your baby. Each class runs for five weeks on the same night and costs $45 for the mother-to-be and her support person. Medicaid covers the cost if you are eligible. Call [phone] to register. [facility] Childbirth Education:  [phone] or 336-832-6848 or [email or web address]  Baby & Me Class: Discuss newborn & infant parenting and family adjustment issues with other new mothers in a relaxed environment. Each week brings a new speaker or baby-centered activity. We encourage new mothers to join us every Thursday at 11:00am. Babies birth until crawling. No registration or fee. Daddy Boot Camp: This course offers Dads-to-be the tools and knowledge needed to feel confident on their journey to becoming new fathers. Experienced dads, who have been trained as coaches, teach dads-to-be how to hold, comfort, diaper, swaddle and play with their infant while being able to support the new mom as well. A class for men taught by men. $25/dad Big Brother/Big Sister: Let your children share in the joy of a new brother or sister in this special class designed just for them. Class includes discussion about how families care for babies: swaddling, holding, diapering, safety as well as how they can be helpful in their new role. This class is designed for children ages 2 to 6, but any age is welcome. Please register each child individually. $5/child  Mom Talk: This mom-led group offers support and connection to mothers as they journey through the adjustments and struggles of that sometimes overwhelming first year after the  birth of a child. Tuesdays at 10:00am and Thursdays at 6:00pm. Babies welcome. No registration or fee. Breastfeeding Support Group: This group is a mother-to-mother support circle where moms have the opportunity to share their breastfeeding experiences. A Lactation Consultant is present for questions and concerns. Meets each Tuesday at 11:00am. No fee or registration. Breastfeeding Your Baby: Learn what to expect in the first days of breastfeeding your newborn.  This class will help you feel more confident with the skills needed to begin your breastfeeding experience. Many new mothers are concerned about breastfeeding after leaving the hospital. This class will also address the most common fears and challenges about breastfeeding during the first few weeks, months and beyond. (call for fee) Comfort Techniques and Tour: This 2 hour interactive class will provide you the opportunity to learn & practice hands-on techniques that can help relieve some of the discomfort of labor and encourage your baby to rotate toward the best position for birth. You and your partner will be able to try a variety of labor positions with birth balls and rebozos as well as practice breathing, relaxation, and visualization techniques. A tour of the [facility] is included with this class. $20 per registrant and support person Childbirth Class- Weekend Option: This class is a Weekend version of our Birth & Baby series.   It is designed for parents who have a difficult time fitting several weeks of classes into their schedule. It covers the care of your newborn and the basics of labor and childbirth. It also includes a [place] [facility] and lunch. The class is held two consecutive days: beginning on Friday evening from 6:30 - 8:30 p.m. and the next day, Saturday from 9 a.m. - 4 p.m. (call for fee) Doren Custard Class: Interested in a waterbirth?  This informational class will help you  discover whether waterbirth is the right fit for you. Education about waterbirth itself, supplies you would need and how to assemble your support team is what you can expect from this class. Some obstetrical practices require this class in order to pursue a waterbirth. (Not all obstetrical practices offer waterbirth-check with your healthcare provider.) Register only the expectant mom, but you are encouraged to bring your partner to class! Required if planning waterbirth, no fee. Infant/Child CPR: Parents, grandparents, babysitters, and friends learn Cardio-Pulmonary Resuscitation skills for infants and children. You will also learn how to treat both conscious and unconscious choking in infants and children. This Family & Friends program does not offer certification. Register each participant individually to ensure that enough mannequins are available. (Call for fee) Grandparent Love: Expecting a grandbaby? This class is for you! Learn about the latest infant care and safety recommendations and ways to support your own child as he or she transitions into the parenting role. Taught by Registered Nurses who are childbirth instructors, but most importantly...they are grandmothers too! $10/person. Childbirth Class- Natural Childbirth: This series of 5 weekly classes is for expectant parents who want to learn and practice natural methods of coping with the process of labor and childbirth. Relaxation, breathing, massage, visualization, role of the partner, and helpful positioning are highlighted. Participants learn how to be confident in their body's ability to give birth. This class will empower and help parents make informed decisions about their own care. Includes discussion that will help new parents transition into the immediate postpartum period. [facility] is included. We suggest taking this class between 25-32 weeks, but it's only a recommendation. $75 per registrant and one  support person or $30 Medicaid. Childbirth Class- 3 week Series: This option of 3 weekly classes helps you and your labor partner prepare for childbirth. Newborn care, labor & birth, cesarean birth, pain management, and comfort techniques are discussed and a [place] [facility] is included. The class meets at the same time, on the same day of the week for 3 consecutive weeks beginning with the starting date you choose. $60 for registrant and one support person.  Marvelous Multiples: Expecting twins, triplets, or more? This class covers the differences in labor, birth, parenting, and breastfeeding issues that face multiples' parents. NICU tour is included. Led by a Certified Childbirth Educator who is the mother of twins. No fee. Caring for Baby: This class is for expectant and adoptive parents who want to learn and practice the most up-to-date newborn care for their babies. Focus is on birth through the first six weeks of life. Topics include feeding, bathing, diapering, crying, umbilical cord care, circumcision care and safe sleep. Parents learn to recognize symptoms of illness and when to call the pediatrician. Register only the mom-to-be and your partner or support person can plan to come with you! $10 per registrant and support person Childbirth Class- online option: This online class offers you the freedom  to complete a Birth and Baby series in the comfort of your own home. The flexibility of this option allows you to review sections at your own pace, at times convenient to you and your support people. It includes additional video information, animations, quizzes, and extended activities. Get organized with helpful eClass tools, checklists, and trackers. Once you register online for the class, you will receive an email within a few days to accept the invitation and begin the class when the time is right for you. The content will be available to you for 60 days. $60 for 60 days of  online access for you and your support people.  Local Doulas: Natural Baby Doulas [email or web address] Tel: [phone] [email or web address] [facility] [phone] [email or web address] The Labor Ladies  (also do waterbirth tub rental) [phone] [email or web address] [facility] Birth [email or web address] 336-312-4678 [email or web address] Sacred Rhythms  [phone] [email or web address] [place] [employer] (PADA) [email or web address] http://[email or web address].org/index.htm [place] Birth and Baby  [email or web address] Considering Waterbirth? Guide for patients at [facility]  Why consider waterbirth?  Gentle birth for babies Less pain medicine used in labor May allow for passive descent/less pushing May reduce perineal tears  More mobility and instinctive maternal position changes Increased maternal relaxation Reduced blood pressure in labor  Is waterbirth safe? What are the risks of infection, drowning or other complications?  Infection: Very low risk (3.7 % for tub vs 4.8% for bed) 7 in 8000 waterbirths with documented infection Poorly cleaned equipment most common cause Slightly lower group B strep transmission rate  Drowning Maternal:  Very low risk   Related to seizures or fainting Newborn:  Very low risk. No evidence of increased risk of respiratory problems in multiple large studies Physiological protection from breathing under water Avoid underwater birth if there are any fetal complications Once baby's head is out of the water, keep it out.  Birth complication Some reports of cord trauma, but risk decreased by bringing baby to surface gradually No evidence of increased risk of shoulder dystocia. Mothers can usually change positions faster in water than in a bed, possibly aiding the maneuvers  to free the shoulder.   You must attend a Waterbirth class at [facility] 3rd Wednesday of every month from 7-9pm Free Register by calling [phone] or online at [email or web address] Bring us the certificate from the class to your prenatal appointment  Meet with a midwife at 36 weeks to see if you can still plan a waterbirth and to sign the consent.   Purchase or rent the following supplies:  Water Birth Pool (Birth Pool in a Box or [place] for instance)  (Tubs start ~$125) Single-use disposable tub liner designed for your brand of tub New garden hose labeled "lead-free", "suitable for drinking water", Electric drain pump to remove water (We recommend 792 gallon per hour or greater pump.)  Separate garden hose to remove the dirty water Fish net Bathing suit top (optional) Long-handled mirror (optional)  Places to purchase or rent supplies [email or web address] for tub purchases and supplies [email or web address] for tub purchases and supplies The Labor Ladies ([email or web address]) $275 for tub rental/set-up & take down/kit  [place] [employer] (http://www.padanc.org/MeetUs.htm) Information regarding doulas (labor support) who provide pool rentals Our practice has a Birth Pool in a Box tub at the hospital that you may borrow on a first-come-first-served basis. It is your responsibility to to set up, clean and break down the tub. We cannot guarantee the availability of this tub in advance. You are responsible for bringing all accessories listed above.   If you do not have all necessary supplies you cannot have a waterbirth.    Things that would prevent you from having a waterbirth: Premature, <37wks Previous cesarean birth Presence of thick meconium-stained fluid Multiple gestation (Twins, triplets, etc.) Uncontrolled diabetes or gestational diabetes requiring medication Hypertension requiring medication or diagnosis of pre-eclampsia Heavy vaginal  bleeding Non-reassuring fetal heart rate Active infection (MRSA, etc.). Group B Strep is NOT a contraindication for  waterbirth. If your labor has to be induced and induction method requires continuous  monitoring of the baby's heart rate Other risks/issues identified by your obstetrical provider  Please remember that birth is unpredictable. Under certain unforeseeable circumstances your provider may advise against giving birth in the tub. These decisions will be made on a case-by-case basis and with the safety of you and your baby as our highest priority.     AREA PEDIATRIC/FAMILY [place] [address] [place], [place]  [address] Phone - [phone]   Fax - [phone]  [facility] [address] [place], [place] [address] Phone - [phone]   Fax - [place] [address], [place]  [address] Phone - [phone]   Fax - [phone]  [place]. [address], [place] [address] [place], [place]  [address] Phone - [phone]   Fax - [phone]  [place] [address] [place], [place]  [address] Phone - [phone]   Fax - [phone]  [facility] [address] [place], [place]  [address] Phone - [phone]   Fax - [place] [address], [place], [place]  [address] Phone - [phone]   Fax - [phone]  [place] [address] [place], [place] [address] [place], [place]  [address] Phone - [phone]   Fax - [place] [address] [place], [place]  [address] Phone - [phone]   Fax - [phone] [facility] [place]. [address] [place], [place]  [address] Phone - [phone]   Fax - [phone]  [place] [address] [place], [place]  [address] Phone - [phone]   Fax - [phone]  [facility] AT [place], [place], [place]  [address] Phone - [phone]   Fax - [place] [address] [place], [place]  [address] Phone - [phone]   Fax - [phone]  [facility] [address] [place], [place]  [address] Phone - [place], [place]  [address] Phone - [phone]   Fax - [place] [address], [place], [place]  [address] Phone - [phone]   Fax - [phone]  [place] [address] [place], [place]  [address] Phone - [phone]   Fax - [place]. [place], [place]  [address] Phone - [phone]   Fax - [place], [place], [place]  [address] Phone - [phone]   Fax - [place] [address] [place] [address], [place], [place]  [address] Phone - [phone]   Fax - [phone]  [facility] RUB[place] [address] [place],   [address] Phone -  [phone]   Fax - [phone]  [facility] [address] [place], [place]  [address] Phone - [phone]   Fax - [phone]  [place] - Alita Chyle [address] [place], [place]  [address] Phone - [phone]   Fax - [phone] Gerarda Fraction [address] [place], [place]  [address] Phone - [phone]   Fax - [phone]  [facility] [address] [place], [place]  [address] Phone - [phone]   Fax - [phone]  [facility] - [place] [address] [place], [place]  [address] Phone - [phone]   Fax - [phone]  [facility] [place] Wyvonne Lenz  MD [address] [place] [address] Phone [phone]  Fax [phone]  Safe Medications in Pregnancy   Acne:  Benzoyl Peroxide  Salicylic Acid   Backache/Headache:  Tylenol: 2 regular strength every 4 hours OR               2 Extra strength every 6 hours   Colds/Coughs/Allergies:  Benadryl (alcohol free) 25 mg every 6 hours as needed  Breath right strips  Claritin  Cepacol throat lozenges  Chloraseptic throat spray  Cold-Eeze- up to three times per day  Cough drops, alcohol free  Flonase (by prescription only)  Guaifenesin  Mucinex  Robitussin DM (plain only, alcohol free)  Saline nasal spray/drops  Sudafed (pseudoephedrine) & Actifed * use only after [redacted] weeks gestation and if you do not have high blood pressure  Tylenol  Vicks Vaporub  Zinc lozenges  Zyrtec   Constipation:  Colace  Ducolax suppositories  Fleet enema  Glycerin suppositories  Metamucil  Milk of magnesia  Miralax  Senokot  Smooth move tea   Diarrhea:  Kaopectate  Imodium A-D   *NO pepto Bismol   Hemorrhoids:  Anusol  Anusol HC  Preparation H  Tucks   Indigestion:  Tums  Maalox  Mylanta  Zantac  Pepcid   Insomnia:  Benadryl (alcohol free)  every 6 hours as needed  Tylenol PM  Unisom, no Gelcaps   Leg Cramps:  Tums  MagGel   Nausea/Vomiting:  Bonine  Dramamine  Emetrol  Ginger extract  Sea bands  Meclizine  Nausea medication to take during pregnancy:  Unisom (doxylamine succinate 25 mg tablets) Take one tablet daily at bedtime. If symptoms are not adequately controlled, the dose can be increased to a maximum recommended dose of two tablets daily (1/2 tablet in the morning, 1/2 tablet mid-afternoon and one at bedtime).  Vitamin B6  tablets. Take one tablet twice a day (up to 200 mg per day).   Skin Rashes:  Aveeno products  Benadryl cream or  every 6 hours as needed  Calamine Lotion  1% cortisone cream   Yeast infection:  Gyne-lotrimin 7   Monistat 7    **If taking multiple medications, please check labels to avoid duplicating the same active ingredients  **take medication as directed on the label  ** Do not exceed 4000 mg of tylenol in 24 hours  **Do not take medications that contain aspirin or ibuprofen

## 2022-11-02 NOTE — Progress Notes (Signed)
PRENATAL VISIT NOTE  Subjective:  Becky Strickland is a 32 y.o. G3P0020 at [redacted]w[redacted]d being seen today for her first prenatal visit for this pregnancy.  She is currently monitored for the following issues for this low-risk pregnancy and has Encounter for pregnancy test and Supervision of low-risk pregnancy on their problem list.  Patient reports no complaints. Contractions: Not present. Vag. Bleeding: None.  Movement: Absent. Denies leaking of fluid.   She is planning to breastfeed. Desires nothing for contraception.   The following portions of the patient's history were reviewed and updated as appropriate: allergies, current medications, past family history, past medical history, past social history, past surgical history and problem list.   Objective:   Vitals:   11/02/22 1554  BP: 101/68  Pulse: 96  Weight: 148 lb 6.4 oz (67.3 kg)    Fetal Status:     Movement: Absent     General:  Alert, oriented and cooperative. Patient is in no acute distress.  Skin: Skin is warm and dry. No rash noted.   Cardiovascular: Normal heart rate and rhythm noted  Respiratory: Normal respiratory effort, no problems with respiration noted. Clear to auscultation.   Abdomen: Soft, gravid, appropriate for gestational age. Normal bowel sounds. Non-tender. Pain/Pressure: Absent     Pelvic: Cervical exam deferred       Normal cervical contour, no lesions, no bleeding following pap, WHITE discharge  Extremities: Normal range of motion.  Edema: None  Mental Status: Normal mood and affect. Normal behavior. Normal judgment and thought content.    Indications for ASA therapy (per uptodate) One of the following: Previous pregnancy with preeclampsia, especially early onset and with an adverse outcome No Multifetal gestation No Chronic hypertension No Type 1 or 2 diabetes mellitus No Chronic kidney disease No Autoimmune disease (antiphospholipid syndrome, systemic lupus erythematosus) No  Two or more of the  following: Nulliparity No Obesity (body mass index >30 kg/m2) No Family history of preeclampsia in mother or sister Yes Age ?35 years No Sociodemographic characteristics (African American race, low socioeconomic level) Yes Personal risk factors (eg, previous pregnancy with low birth weight or small for gestational age infant, previous adverse pregnancy outcome [eg, stillbirth], interval >10 years between pregnancies) Yes  Indications for early GDM screening  First-degree relative with diabetes No BMI >30kg/m2 No Age > 35 No Previous birth of an infant weighing ?4000 g No Gestational diabetes mellitus in a previous pregnancy No Glycated hemoglobin ?5.7 percent (39 mmol/mol), impaired glucose tolerance, or impaired fasting glucose on previous testing No High-risk race/ethnicity (eg, African American, Latino, Native American, [place] American, Pacific Islander) Yes Previous stillbirth of unknown cause No Maternal birthweight > 9 lbs No History of cardiovascular disease No Hypertension or on therapy for hypertension No High-density lipoprotein cholesterol level <35 mg/dL (1.61 mmol/L) and/or a triglyceride level >250 mg/dL (0.96 mmol/L) No Polycystic ovary syndrome No Physical inactivity No Other clinical condition associated with insulin resistance (eg, severe obesity, acanthosis nigricans) No Current use of glucocorticoids No  Early screening tests: FBS, A1C, Random CBG, glucose challenge  Assessment and Plan:  Pregnancy: G3P0020 at [redacted]w[redacted]d  1. Encounter for supervision of low-risk pregnancy in second trimester - Cytology - PAP( [place]) - Panorama Prenatal Test Full Panel - [place] Basic Panel - Culture, OB Urine - Hemoglobin A1c - CBC/D/Plt+RPR+Rh+ABO+RubIgG... - aspirin EC 81 MG tablet; Take 1 tablet (81 mg total) by mouth daily. Swallow whole.  Dispense: 30 tablet; Refill: 12   ASA started. Pap collected.  NURSING  PROVIDER  Conservator, museum/gallery for Women Dating by  LMP c/w U/S at 19 wks  [facility] Model Traditional Anatomy U/S   Initiated care at  [employer]  English              LAB RESULTS   Support Person FOB Genetics NIPS:             AFP:                NT/IT (FT only)     Carrier Screen Horizon:  Rhogam  --/--/O POS (06/21 1337) A1C/GTT Early:             Third trimester:  Flu Vaccine     TDaP Vaccine   Blood Type --/--/O POS (06/21 1337)  Covid Vaccine Miderna 2 doses  Antibody      Rubella    Feeding Plan breast RPR    Contraception No  HBsAg    Circumcision If boy, yes HIV    Pediatrician  List given  HCVAb    Prenatal Classes       Pap No results found for: "DIAGPAP"  BTLConsent  GC/CT Initial:             36wks:  VBAC  Consent  GBS   For PCN allergy, check sensitivities        DME Rx  BP cuff  Weight Scale Waterbirth   Class  Consent  CNM visit  PHQ9 & GAD7 [  ] new OB [  ] 28 weeks  [  ] 36 weeks Induction   Orders Entered Foley Y/N     Preterm labor/first trimester warning symptoms and general obstetric precautions including but not limited to vaginal bleeding, contractions, leaking of fluid and fetal movement were reviewed in detail with the patient. Please refer to After Visit Summary for other counseling recommendations.   Discussed the normal visit cadence for prenatal care Discussed the nature of our practice with multiple providers including residents and students   Return in about 4 weeks (around 11/30/2022) for ROB.  Future Appointments  Date Time Provider Department Center  12/24/2022  2:30 PM [facility] NURSE [facility]  12/24/2022  2:45 PM [facility] US4 [facility]    Myrtie Hawk, DO FMOB Fellow, Faculty practice [facility] 11/02/22  4:39 PM

## 2022-11-02 NOTE — Addendum Note (Signed)
Addended by: Marjo Bicker on: 11/02/2022 05:03 PM   Modules accepted: Orders

## 2022-11-03 LAB — CBC/D/PLT+RPR+RH+ABO+RUBIGG...
Antibody Screen: NEGATIVE
Basophils Absolute: 0 10*3/uL (ref 0.0–0.2)
Basos: 1 %
EOS (ABSOLUTE): 0.2 10*3/uL (ref 0.0–0.4)
Eos: 3 %
HCV Ab: NONREACTIVE
HIV Screen 4th Generation wRfx: NONREACTIVE
Hematocrit: 41.4 % (ref 34.0–46.6)
Hemoglobin: 13.2 g/dL (ref 11.1–15.9)
Hepatitis B Surface Ag: NEGATIVE
Immature Grans (Abs): 0 10*3/uL (ref 0.0–0.1)
Immature Granulocytes: 0 %
Lymphocytes Absolute: 1.4 10*3/uL (ref 0.7–3.1)
Lymphs: 27 %
MCH: 28.1 pg (ref 26.6–33.0)
MCHC: 31.9 g/dL (ref 31.5–35.7)
MCV: 88 fL (ref 79–97)
Monocytes Absolute: 0.5 10*3/uL (ref 0.1–0.9)
Monocytes: 9 %
Neutrophils Absolute: 3.2 10*3/uL (ref 1.4–7.0)
Neutrophils: 60 %
Platelets: 324 10*3/uL (ref 150–450)
RBC: 4.7 x10E6/uL (ref 3.77–5.28)
RDW: 13.4 % (ref 11.7–15.4)
RPR Ser Ql: NONREACTIVE
Rh Factor: POSITIVE
Rubella Antibodies, IGG: 1.12 index (ref >0.99)
WBC: 5.2 10*3/uL (ref 3.4–10.8)

## 2022-11-03 LAB — HEMOGLOBIN A1C
Est. average glucose Bld gHb Est-mCnc: 108 mg/dL
Hgb A1c MFr Bld: 5.4 % (ref 4.8–5.6)

## 2022-11-03 LAB — HCV INTERPRETATION

## 2022-11-03 NOTE — Addendum Note (Signed)
Addended by: Marjo Bicker on: 11/03/2022 02:28 PM   Modules accepted: Orders

## 2022-11-04 LAB — CERVICOVAGINAL ANCILLARY ONLY
Bacterial Vaginitis (gardnerella): NEGATIVE
Candida Glabrata: NEGATIVE
Candida Vaginitis: POSITIVE — AB
Chlamydia: NEGATIVE
Comment: NEGATIVE
Comment: NEGATIVE
Comment: NEGATIVE
Comment: NEGATIVE
Comment: NEGATIVE
Comment: NORMAL
Neisseria Gonorrhea: NEGATIVE
Trichomonas: NEGATIVE

## 2022-11-05 LAB — URINE CULTURE, OB REFLEX

## 2022-11-05 LAB — CULTURE, OB URINE

## 2022-11-06 LAB — CYTOLOGY - PAP
Chlamydia: NEGATIVE
Comment: NEGATIVE
Comment: NEGATIVE
Comment: NORMAL
Diagnosis: NEGATIVE
High risk HPV: NEGATIVE
Neisseria Gonorrhea: NEGATIVE

## 2022-11-08 ENCOUNTER — Other Ambulatory Visit: Payer: Self-pay | Admitting: Family Medicine

## 2022-11-08 DIAGNOSIS — B3731 Acute candidiasis of vulva and vagina: Secondary | ICD-10-CM

## 2022-11-08 MED ORDER — MICONAZOLE NITRATE 2 % EX CREA: 1.0000 | TOPICAL_CREAM | Freq: Two times a day (BID) | CUTANEOUS | 0 refills | Status: AC

## 2022-11-10 IMAGING — US US OB < 14 WEEKS - US OB TV
1 series · 15 of 28 positions shown · non-contrast
Comparison: None Available.

CLINICAL DATA: Vaginal bleeding.

EXAM:
OBSTETRIC <14 WK US AND TRANSVAGINAL OB US
TECHNIQUE: Both transabdominal and transvaginal ultrasound examinations were
performed for complete evaluation of the gestation as well as the
maternal uterus, adnexal regions, and pelvic cul-de-sac.
Transvaginal technique was performed to assess early pregnancy.

[Series 1: us ob < 14 weeks - us ob tv · 15 of 46 slices shown]
[im 1/46]
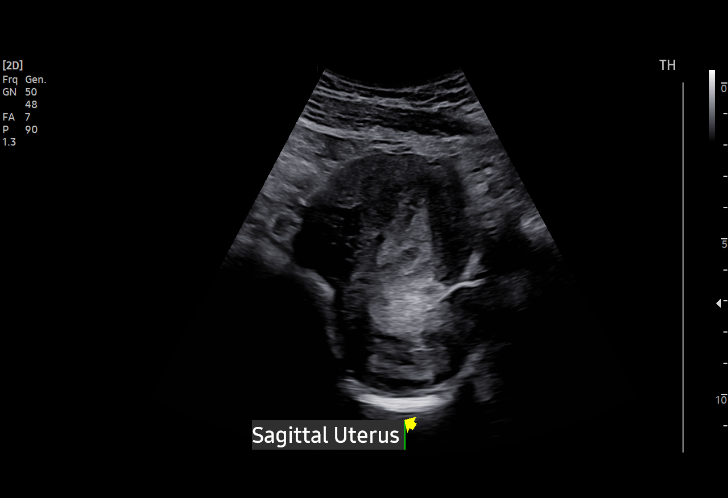
[im 4/46]
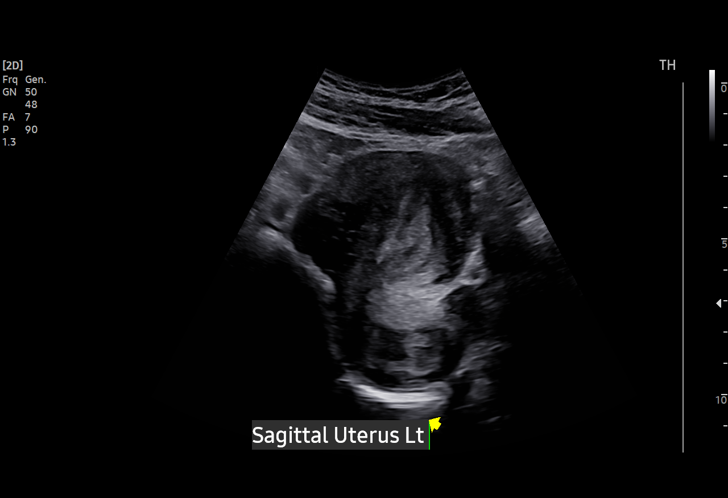
[im 7/46]
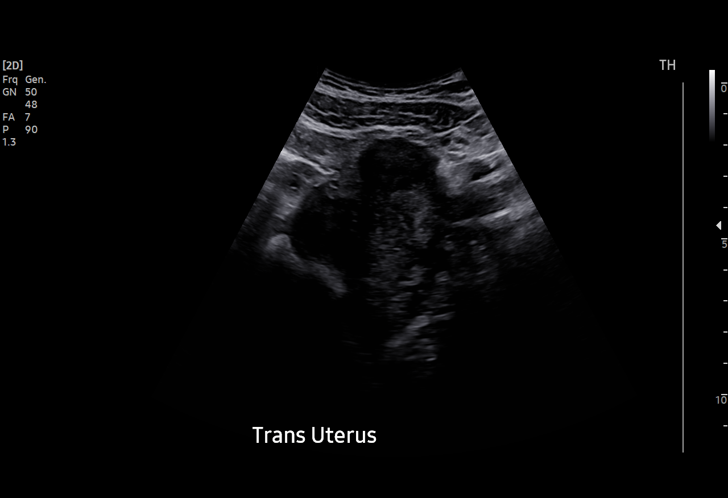
[im 11/46]
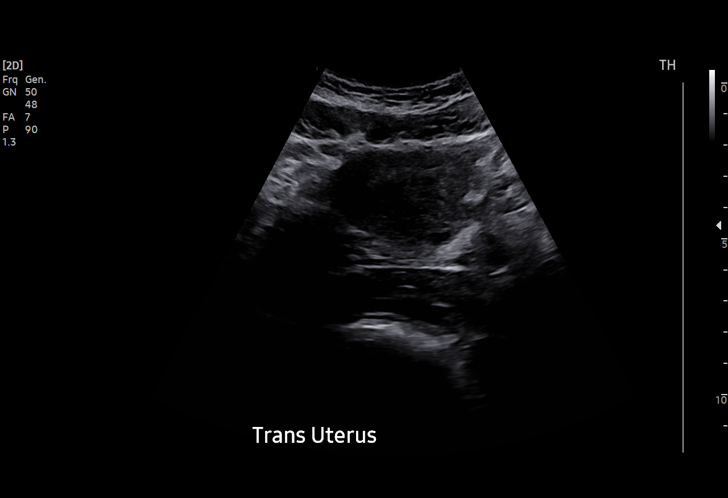
[im 14/46]
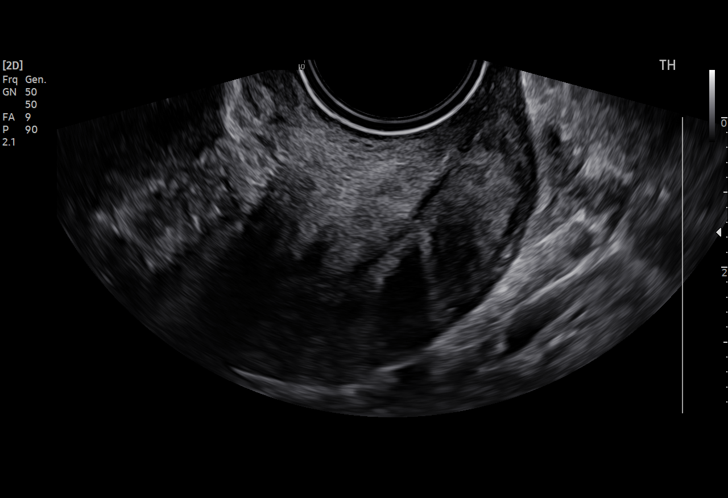
[im 17/46]
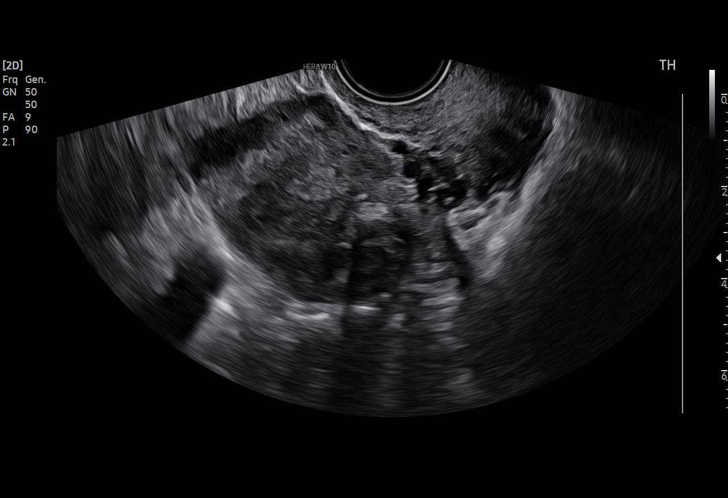
[im 21/46]
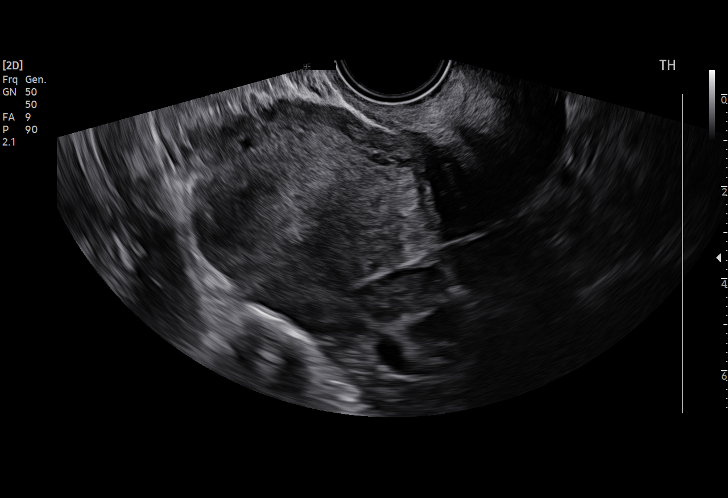
[im 24/46]
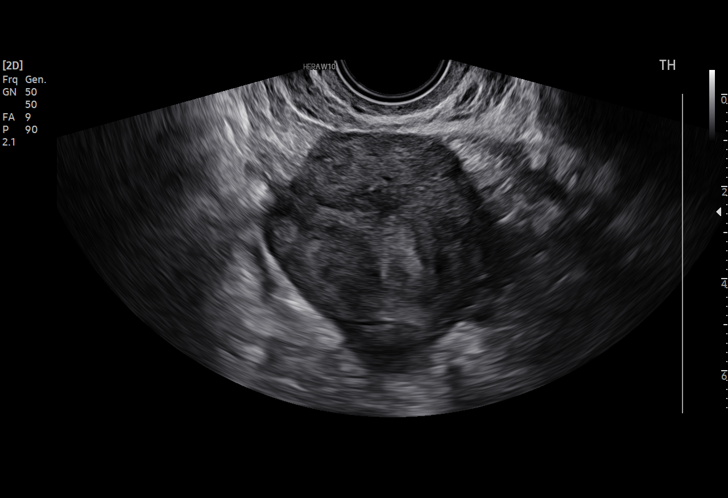
[im 26/46]
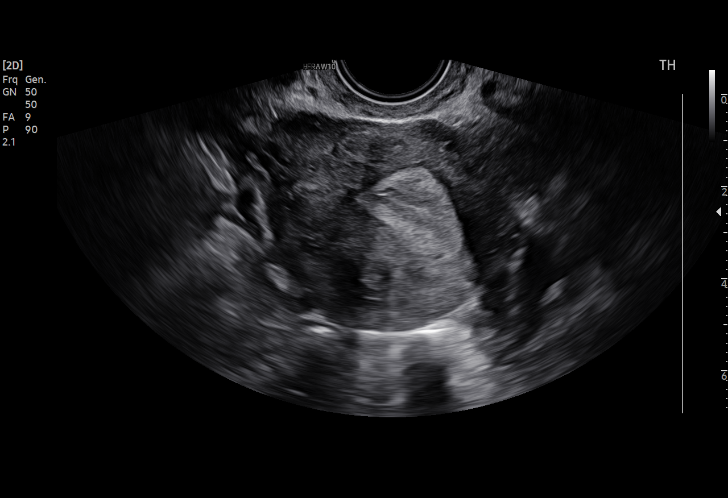
[im 29/46]
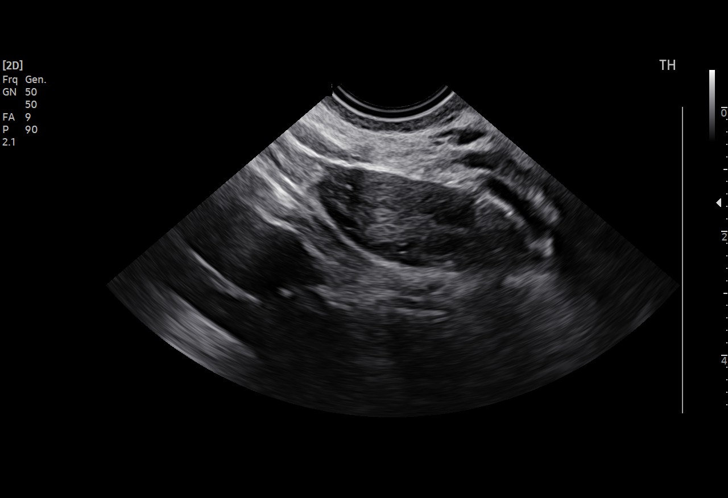
[im 32/46]
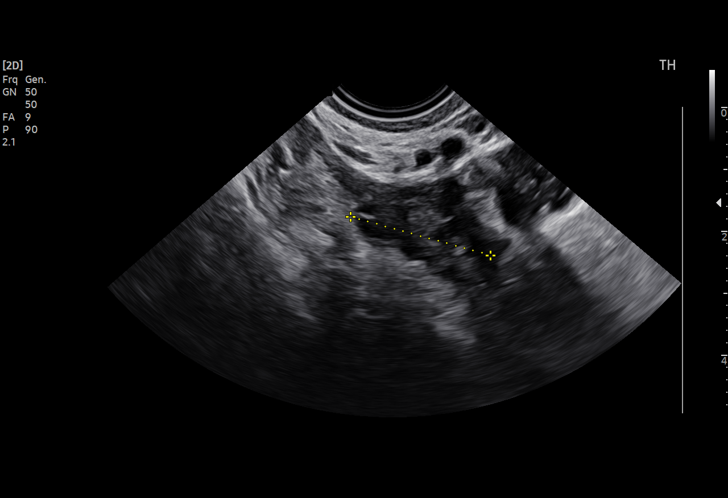
[im 36/46]
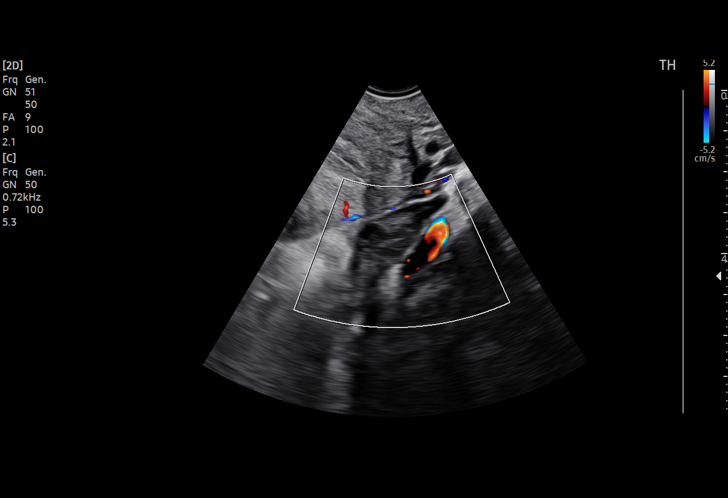
[im 39/46]
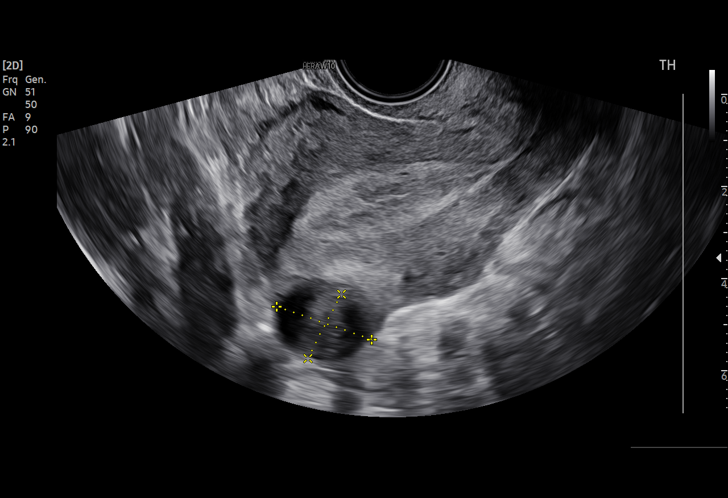
[im 42/46]
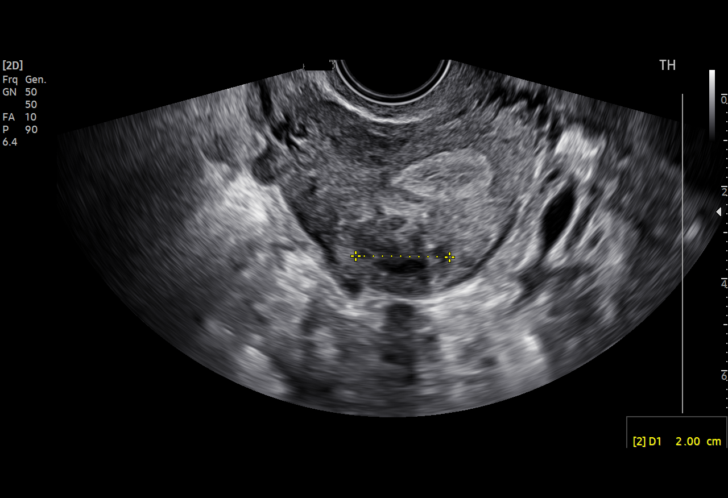
[im 46/46]
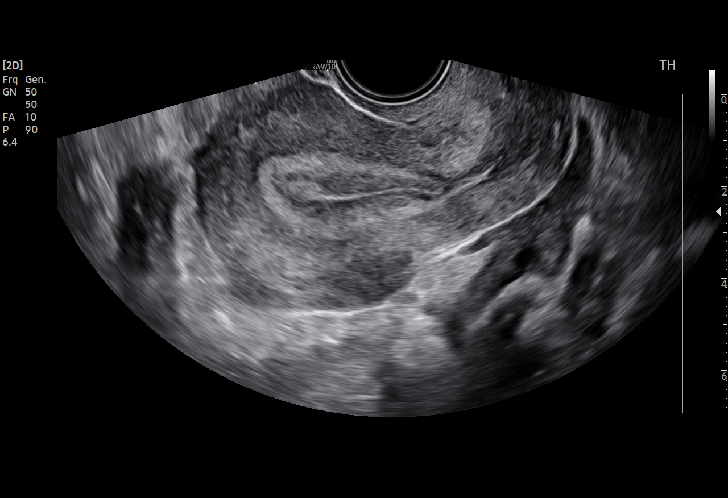

[15 of 28 positions shown; findings below may reference images not displayed]

FINDINGS: Intrauterine gestational sac: None

Yolk sac:  Not Visualized.

Embryo:  Not Visualized.

Cardiac Activity: Not Visualized.

Subchorionic hemorrhage:  None visualized.

Maternal uterus/adnexae: Ovaries are unremarkable. No free fluid is
noted. Multiple fibroids are noted.
IMPRESSION: No intrauterine gestational sac, yolk sac, fetal pole, or cardiac
activity visualized. Differential considerations include
intrauterine gestation too early to be sonographically visualized,
spontaneous abortion, or ectopic pregnancy. Consider follow-up
ultrasound in 14 days and serial quantitative beta HCG follow-up.

## 2022-11-11 LAB — PANORAMA PRENATAL TEST FULL PANEL:PANORAMA TEST PLUS 5 ADDITIONAL MICRODELETIONS: FETAL FRACTION: 3.3

## 2022-11-13 LAB — HORIZON CUSTOM: REPORT SUMMARY: POSITIVE — AB

## 2022-11-16 ENCOUNTER — Telehealth: Payer: Self-pay | Admitting: Family Medicine

## 2022-11-16 MED ORDER — PREPLUS 27-1 MG PO TABS: 1.0000 | ORAL_TABLET | Freq: Every day | ORAL | 11 refills | Status: DC

## 2022-11-16 MED ORDER — PRENATAL VITAMINS 28-0.8 MG PO TABS: 1.0000 | ORAL_TABLET | Freq: Every day | ORAL | 11 refills | Status: DC

## 2022-11-16 NOTE — Telephone Encounter (Signed)
Called patient at number listed in chart--[phone]--and identified with name and DOB. Patient requested that we send in prenatal vitamins to pharmacy; patient has been buying prenatals on her own, but she wanted to double check and see if we could prescribe her some. Patient complaining of low energy and believes it is due to low iron.   Verified pharmacy with patient and informed her that I would send the Rx for prenatal vitamins today, and that she should be able to pick them up tomorrow dependent on the pharmacy. Patient had no other questions or concerns.   Maureen Ralphs RN on 11/16/22 at [phone]

## 2022-11-16 NOTE — Telephone Encounter (Signed)
Patient has questions about prenatal vitamins

## 2022-11-18 ENCOUNTER — Encounter: Payer: Self-pay | Admitting: Family Medicine

## 2022-11-18 DIAGNOSIS — Z3491 Encounter for supervision of normal pregnancy, unspecified, first trimester: Secondary | ICD-10-CM

## 2022-11-19 DIAGNOSIS — Z3480 Encounter for supervision of other normal pregnancy, unspecified trimester: Secondary | ICD-10-CM | POA: Diagnosis not present

## 2022-11-26 ENCOUNTER — Encounter: Payer: Self-pay | Admitting: *Deleted

## 2022-11-30 ENCOUNTER — Encounter: Payer: Medicaid Other | Admitting: Family Medicine

## 2022-12-01 ENCOUNTER — Other Ambulatory Visit: Payer: Self-pay

## 2022-12-01 ENCOUNTER — Ambulatory Visit (INDEPENDENT_AMBULATORY_CARE_PROVIDER_SITE_OTHER): Payer: Medicaid Other | Admitting: Family Medicine

## 2022-12-01 ENCOUNTER — Encounter: Payer: Self-pay | Admitting: Family Medicine

## 2022-12-01 VITALS — BP 110/74 | HR 97 | Wt 145.2 lb

## 2022-12-01 DIAGNOSIS — Z3492 Encounter for supervision of normal pregnancy, unspecified, second trimester: Secondary | ICD-10-CM

## 2022-12-01 DIAGNOSIS — K5900 Constipation, unspecified: Secondary | ICD-10-CM

## 2022-12-01 DIAGNOSIS — D563 Thalassemia minor: Secondary | ICD-10-CM

## 2022-12-01 DIAGNOSIS — Z0001 Encounter for general adult medical examination with abnormal findings: Secondary | ICD-10-CM

## 2022-12-01 DIAGNOSIS — Z3A16 16 weeks gestation of pregnancy: Secondary | ICD-10-CM

## 2022-12-01 DIAGNOSIS — O99612 Diseases of the digestive system complicating pregnancy, second trimester: Secondary | ICD-10-CM

## 2022-12-01 MED ORDER — POLYETHYLENE GLYCOL 3350 17 GM/SCOOP PO POWD: 17.0000 g | Freq: Every day | ORAL | 1 refills | Status: DC | PRN

## 2022-12-01 NOTE — Patient Instructions (Signed)

## 2022-12-01 NOTE — Progress Notes (Signed)
   Subjective:  Becky Strickland is a 32 y.o. G3P0020 at [redacted]w[redacted]d being seen today for ongoing prenatal care.  She is currently monitored for the following issues for this low-risk pregnancy and has Supervision of low-risk pregnancy and Alpha thalassemia silent carrier on their problem list.  Patient reports no complaints.  Contractions: Not present. Vag. Bleeding: None.  Movement: Present. Denies leaking of fluid.   The following portions of the patient's history were reviewed and updated as appropriate: allergies, current medications, past family history, past medical history, past social history, past surgical history and problem list. Problem list updated.  Objective:   Vitals:   12/01/22 1644  BP: 110/74  Pulse: 97  Weight: 145 lb 3.2 oz (65.9 kg)    Fetal Status: Fetal Heart Rate (bpm): 164   Movement: Present     General:  Alert, oriented and cooperative. Patient is in no acute distress.  Skin: Skin is warm and dry. No rash noted.   Cardiovascular: Normal heart rate noted  Respiratory: Normal respiratory effort, no problems with respiration noted  Abdomen: Soft, gravid, appropriate for gestational age. Pain/Pressure: Absent     Pelvic: Vag. Bleeding: None     Cervical exam deferred        Extremities: Normal range of motion.     Mental Status: Normal mood and affect. Normal behavior. Normal judgment and thought content.   Urinalysis:      Assessment and Plan:  Pregnancy: G3P0020 at [redacted]w[redacted]d  1. Encounter for supervision of low-risk pregnancy in second trimester BP and FHR normal AFP today Having some constipation, rx sent for miralax - AFP, Serum, Open Spina Bifida  2. Alpha thalassemia silent carrier Given partner testing kit today Partner asking about options for education around childbirth/parenting, referred to [facility] healthy baby website  Preterm labor symptoms and general obstetric precautions including but not limited to vaginal bleeding, contractions, leaking of fluid  and fetal movement were reviewed in detail with the patient. Please refer to After Visit Summary for other counseling recommendations.  Return in 4 weeks (on 12/29/2022) for Dyad patient, ob visit.   Venora Maples, MD

## 2022-12-10 LAB — AFP, SERUM, OPEN SPINA BIFIDA
AFP MoM: 0.58
AFP Value: 23.9 ng/mL
Gest. Age on Collection Date: 16.6 weeks
Maternal Age At EDD: 29.9 yr
OSBR Risk 1 IN: 10000
Test Results:: NEGATIVE
Weight: 145 [lb_av]

## 2022-12-24 ENCOUNTER — Ambulatory Visit: Payer: Medicaid Other | Admitting: *Deleted

## 2022-12-24 ENCOUNTER — Encounter: Payer: Self-pay | Admitting: *Deleted

## 2022-12-24 ENCOUNTER — Other Ambulatory Visit: Payer: Self-pay | Admitting: Family Medicine

## 2022-12-24 ENCOUNTER — Other Ambulatory Visit: Payer: Self-pay | Admitting: *Deleted

## 2022-12-24 ENCOUNTER — Ambulatory Visit: Payer: Medicaid Other | Attending: Family Medicine

## 2022-12-24 VITALS — BP 104/63 | HR 82

## 2022-12-24 DIAGNOSIS — O35EXX0 Maternal care for other (suspected) fetal abnormality and damage, fetal genitourinary anomalies, not applicable or unspecified: Secondary | ICD-10-CM

## 2022-12-24 DIAGNOSIS — Z363 Encounter for antenatal screening for malformations: Secondary | ICD-10-CM | POA: Diagnosis not present

## 2022-12-24 DIAGNOSIS — O99012 Anemia complicating pregnancy, second trimester: Secondary | ICD-10-CM | POA: Diagnosis not present

## 2022-12-24 DIAGNOSIS — Z3491 Encounter for supervision of normal pregnancy, unspecified, first trimester: Secondary | ICD-10-CM | POA: Insufficient documentation

## 2022-12-24 DIAGNOSIS — Z362 Encounter for other antenatal screening follow-up: Secondary | ICD-10-CM

## 2022-12-24 DIAGNOSIS — D563 Thalassemia minor: Secondary | ICD-10-CM | POA: Diagnosis present

## 2022-12-24 DIAGNOSIS — O3412 Maternal care for benign tumor of corpus uteri, second trimester: Secondary | ICD-10-CM | POA: Diagnosis not present

## 2022-12-24 DIAGNOSIS — Z3A19 19 weeks gestation of pregnancy: Secondary | ICD-10-CM | POA: Diagnosis not present

## 2022-12-24 DIAGNOSIS — D259 Leiomyoma of uterus, unspecified: Secondary | ICD-10-CM

## 2022-12-28 ENCOUNTER — Other Ambulatory Visit: Payer: Self-pay

## 2022-12-28 ENCOUNTER — Ambulatory Visit (INDEPENDENT_AMBULATORY_CARE_PROVIDER_SITE_OTHER): Payer: Medicaid Other | Admitting: Family Medicine

## 2022-12-28 VITALS — BP 103/73 | HR 92 | Wt 147.2 lb

## 2022-12-28 DIAGNOSIS — D251 Intramural leiomyoma of uterus: Secondary | ICD-10-CM

## 2022-12-28 DIAGNOSIS — Z3492 Encounter for supervision of normal pregnancy, unspecified, second trimester: Secondary | ICD-10-CM

## 2022-12-28 DIAGNOSIS — D259 Leiomyoma of uterus, unspecified: Secondary | ICD-10-CM | POA: Insufficient documentation

## 2022-12-28 NOTE — Patient Instructions (Signed)

## 2022-12-28 NOTE — Progress Notes (Signed)
   PRENATAL VISIT NOTE  Subjective:  Becky Strickland is a 32 y.o. G3P0020 at 109w3d being seen today for ongoing prenatal care.  She is currently monitored for the following issues for this low-risk pregnancy and has Supervision of low-risk pregnancy; Alpha thalassemia silent carrier; and Fibroid uterus on their problem list.  Patient reports no complaints.  Contractions: Irritability. Vag. Bleeding: None.  Movement: Present. Denies leaking of fluid.   The following portions of the patient's history were reviewed and updated as appropriate: allergies, current medications, past family history, past medical history, past social history, past surgical history and problem list.   Objective:   Vitals:   12/28/22 1354  BP: 103/73  Pulse: 92  Weight: 147 lb 3.2 oz (66.8 kg)    Fetal Status: Fetal Heart Rate (bpm): 157   Movement: Present     General:  Alert, oriented and cooperative. Patient is in no acute distress.  Skin: Skin is warm and dry. No rash noted.   Cardiovascular: Normal heart rate noted  Respiratory: Normal respiratory effort, no problems with respiration noted  Abdomen: Soft, gravid, appropriate for gestational age.  Pain/Pressure: Present     Pelvic: Cervical exam deferred        Extremities: Normal range of motion.     Mental Status: Normal mood and affect. Normal behavior. Normal judgment and thought content.   Assessment and Plan:  Pregnancy: G3P0020 at [redacted]w[redacted]d 1. Intramural leiomyoma of uterus Largest is 5cm Needs cervical length next week per Dr. Judeth Cornfield Will try to coordinate this  2. Encounter for supervision of low-risk pregnancy in second trimester Up to date No concerns today Not taking ASA -- has first degree relative (Mother) with PEC and additional 2 minor RF.  Preterm labor symptoms and general obstetric precautions including but not limited to vaginal bleeding, contractions, leaking of fluid and fetal movement were reviewed in detail with the  patient. Please refer to After Visit Summary for other counseling recommendations.   Return in about 4 weeks (around 01/25/2023) for Routine prenatal care, Mom+Baby Combined Care.  Future Appointments  Date Time Provider Department Center  01/28/2023  2:55 PM Venora Maples, MD [facility]  02/19/2023  3:30 PM [facility] NURSE [facility]  02/19/2023  3:45 PM [facility] US4 [facility]    Federico Flake, MD

## 2023-01-12 ENCOUNTER — Other Ambulatory Visit: Payer: Self-pay | Admitting: Obstetrics and Gynecology

## 2023-01-12 ENCOUNTER — Encounter: Payer: Self-pay | Admitting: *Deleted

## 2023-01-12 ENCOUNTER — Ambulatory Visit: Payer: Medicaid Other

## 2023-01-12 ENCOUNTER — Ambulatory Visit: Payer: Medicaid Other | Admitting: *Deleted

## 2023-01-12 VITALS — BP 101/64 | HR 81

## 2023-01-12 DIAGNOSIS — Z362 Encounter for other antenatal screening follow-up: Secondary | ICD-10-CM

## 2023-01-12 DIAGNOSIS — O35EXX0 Maternal care for other (suspected) fetal abnormality and damage, fetal genitourinary anomalies, not applicable or unspecified: Secondary | ICD-10-CM

## 2023-01-12 DIAGNOSIS — D259 Leiomyoma of uterus, unspecified: Secondary | ICD-10-CM

## 2023-01-20 ENCOUNTER — Other Ambulatory Visit: Payer: Self-pay | Admitting: Obstetrics and Gynecology

## 2023-01-20 DIAGNOSIS — O35EXX0 Maternal care for other (suspected) fetal abnormality and damage, fetal genitourinary anomalies, not applicable or unspecified: Secondary | ICD-10-CM

## 2023-01-20 DIAGNOSIS — Z362 Encounter for other antenatal screening follow-up: Secondary | ICD-10-CM

## 2023-01-20 DIAGNOSIS — D259 Leiomyoma of uterus, unspecified: Secondary | ICD-10-CM

## 2023-01-21 ENCOUNTER — Ambulatory Visit: Payer: Medicaid Other | Attending: Obstetrics and Gynecology

## 2023-01-21 ENCOUNTER — Ambulatory Visit: Payer: Medicaid Other | Admitting: *Deleted

## 2023-01-21 VITALS — BP 119/67 | HR 91

## 2023-01-21 DIAGNOSIS — Z362 Encounter for other antenatal screening follow-up: Secondary | ICD-10-CM | POA: Diagnosis present

## 2023-01-21 DIAGNOSIS — D563 Thalassemia minor: Secondary | ICD-10-CM

## 2023-01-21 DIAGNOSIS — O3412 Maternal care for benign tumor of corpus uteri, second trimester: Secondary | ICD-10-CM | POA: Insufficient documentation

## 2023-01-21 DIAGNOSIS — Z3A23 23 weeks gestation of pregnancy: Secondary | ICD-10-CM | POA: Diagnosis not present

## 2023-01-21 DIAGNOSIS — D259 Leiomyoma of uterus, unspecified: Secondary | ICD-10-CM | POA: Insufficient documentation

## 2023-01-21 DIAGNOSIS — O35EXX0 Maternal care for other (suspected) fetal abnormality and damage, fetal genitourinary anomalies, not applicable or unspecified: Secondary | ICD-10-CM | POA: Insufficient documentation

## 2023-01-21 DIAGNOSIS — O99012 Anemia complicating pregnancy, second trimester: Secondary | ICD-10-CM

## 2023-01-28 ENCOUNTER — Other Ambulatory Visit: Payer: Self-pay

## 2023-01-28 ENCOUNTER — Ambulatory Visit (INDEPENDENT_AMBULATORY_CARE_PROVIDER_SITE_OTHER): Payer: Medicaid Other | Admitting: Family Medicine

## 2023-01-28 ENCOUNTER — Encounter: Payer: Self-pay | Admitting: Family Medicine

## 2023-01-28 VITALS — BP 109/74 | HR 86 | Wt 150.0 lb

## 2023-01-28 DIAGNOSIS — M62830 Muscle spasm of back: Secondary | ICD-10-CM

## 2023-01-28 DIAGNOSIS — D563 Thalassemia minor: Secondary | ICD-10-CM

## 2023-01-28 DIAGNOSIS — Z3492 Encounter for supervision of normal pregnancy, unspecified, second trimester: Secondary | ICD-10-CM

## 2023-01-28 DIAGNOSIS — D251 Intramural leiomyoma of uterus: Secondary | ICD-10-CM

## 2023-01-28 MED ORDER — CYCLOBENZAPRINE HCL 5 MG PO TABS
10.0000 mg | ORAL_TABLET | Freq: Three times a day (TID) | ORAL | 0 refills | Status: DC | PRN
Start: 2023-01-28 — End: 2023-03-03

## 2023-01-28 NOTE — Patient Instructions (Signed)

## 2023-01-28 NOTE — Progress Notes (Signed)
   Subjective:  Becky Strickland is a 32 y.o. G3P0020 at [redacted]w[redacted]d being seen today for ongoing prenatal care.  She is currently monitored for the following issues for this low-risk pregnancy and has Supervision of low-risk pregnancy; Alpha thalassemia silent carrier; and Fibroid uterus on their problem list.  Patient reports no complaints.  Contractions: Not present. Vag. Bleeding: None.  Movement: Present. Denies leaking of fluid.   The following portions of the patient's history were reviewed and updated as appropriate: allergies, current medications, past family history, past medical history, past social history, past surgical history and problem list. Problem list updated.  Objective:   Vitals:   01/28/23 1510  BP: 109/74  Pulse: 86  Weight: 150 lb (68 kg)    Fetal Status: Fetal Heart Rate (bpm): 155   Movement: Present     General:  Alert, oriented and cooperative. Patient is in no acute distress.  Skin: Skin is warm and dry. No rash noted.   Cardiovascular: Normal heart rate noted  Respiratory: Normal respiratory effort, no problems with respiration noted  Abdomen: Soft, gravid, appropriate for gestational age. Pain/Pressure: Absent     Pelvic: Vag. Bleeding: None     Cervical exam deferred        Extremities: Normal range of motion.     Mental Status: Normal mood and affect. Normal behavior. Normal judgment and thought content.   Urinalysis:      Assessment and Plan:  Pregnancy: G3P0020 at [redacted]w[redacted]d  1. Encounter for supervision of low-risk pregnancy in second trimester BP and FHR normal Advised of fasting labs for next visit Having some back pain, rx sent for flexeril Discussed ok to get a perm  2. Intramural leiomyoma of uterus Multiple intramurals, following w MFM q4-6 weks  3. Alpha thalassemia silent carrier Partner reports +SMA and sickle trait but not alpha thal  Preterm labor symptoms and general obstetric precautions including but not limited to vaginal bleeding,  contractions, leaking of fluid and fetal movement were reviewed in detail with the patient. Please refer to After Visit Summary for other counseling recommendations.  Return in 4 weeks (on 02/25/2023) for Dyad patient, ob visit.   Venora Maples, MD

## 2023-02-17 DIAGNOSIS — O35EXX0 Maternal care for other (suspected) fetal abnormality and damage, fetal genitourinary anomalies, not applicable or unspecified: Secondary | ICD-10-CM | POA: Insufficient documentation

## 2023-02-18 ENCOUNTER — Other Ambulatory Visit: Payer: Self-pay

## 2023-02-18 DIAGNOSIS — Z3491 Encounter for supervision of normal pregnancy, unspecified, first trimester: Secondary | ICD-10-CM

## 2023-02-19 ENCOUNTER — Other Ambulatory Visit: Payer: Self-pay

## 2023-02-19 ENCOUNTER — Other Ambulatory Visit: Payer: Medicaid Other

## 2023-02-19 ENCOUNTER — Ambulatory Visit (INDEPENDENT_AMBULATORY_CARE_PROVIDER_SITE_OTHER): Payer: Medicaid Other | Admitting: Obstetrics and Gynecology

## 2023-02-19 ENCOUNTER — Ambulatory Visit: Payer: Medicaid Other | Attending: Obstetrics and Gynecology

## 2023-02-19 ENCOUNTER — Other Ambulatory Visit: Payer: Self-pay | Admitting: *Deleted

## 2023-02-19 ENCOUNTER — Ambulatory Visit: Payer: Medicaid Other | Admitting: *Deleted

## 2023-02-19 VITALS — BP 97/70 | HR 98 | Wt 153.2 lb

## 2023-02-19 VITALS — BP 103/60 | HR 92

## 2023-02-19 DIAGNOSIS — D259 Leiomyoma of uterus, unspecified: Secondary | ICD-10-CM | POA: Insufficient documentation

## 2023-02-19 DIAGNOSIS — Z3A28 28 weeks gestation of pregnancy: Secondary | ICD-10-CM | POA: Diagnosis not present

## 2023-02-19 DIAGNOSIS — O3413 Maternal care for benign tumor of corpus uteri, third trimester: Secondary | ICD-10-CM | POA: Diagnosis not present

## 2023-02-19 DIAGNOSIS — D563 Thalassemia minor: Secondary | ICD-10-CM

## 2023-02-19 DIAGNOSIS — Z3492 Encounter for supervision of normal pregnancy, unspecified, second trimester: Secondary | ICD-10-CM

## 2023-02-19 DIAGNOSIS — O35EXX0 Maternal care for other (suspected) fetal abnormality and damage, fetal genitourinary anomalies, not applicable or unspecified: Secondary | ICD-10-CM | POA: Diagnosis present

## 2023-02-19 DIAGNOSIS — Z362 Encounter for other antenatal screening follow-up: Secondary | ICD-10-CM | POA: Diagnosis not present

## 2023-02-19 DIAGNOSIS — O3412 Maternal care for benign tumor of corpus uteri, second trimester: Secondary | ICD-10-CM | POA: Diagnosis present

## 2023-02-19 DIAGNOSIS — D251 Intramural leiomyoma of uterus: Secondary | ICD-10-CM

## 2023-02-19 DIAGNOSIS — O285 Abnormal chromosomal and genetic finding on antenatal screening of mother: Secondary | ICD-10-CM

## 2023-02-19 DIAGNOSIS — Z3491 Encounter for supervision of normal pregnancy, unspecified, first trimester: Secondary | ICD-10-CM | POA: Diagnosis not present

## 2023-02-19 NOTE — Patient Instructions (Addendum)
 [email or web address] Considering Waterbirth? Guide for patients at Center for [facility]) Why consider waterbirth? Gentle birth for babies  Less pain medicine used in labor  May allow for passive descent/less pushing  May reduce perineal tears  More mobility and instinctive maternal position changes  Increased maternal relaxation   Is waterbirth safe? What are the risks of infection, drowning or other complications? Infection:  Very low risk (3.7 % for tub vs 4.8% for bed)  7 in 8000 waterbirths with documented infection  Poorly cleaned equipment most common cause  Slightly lower group B strep transmission rate  Drowning  Maternal:  Very low risk  Related to seizures or fainting  Newborn:  Very low risk. No evidence of increased risk of respiratory problems in multiple large studies  Physiological protection from breathing under water  Avoid underwater birth if there are any fetal complications  Once baby's head is out of the water, keep it out.  Birth complication  Some reports of cord trauma, but risk decreased by bringing baby to surface gradually  No evidence of increased risk of shoulder dystocia. Mothers can usually change positions faster in water than in a bed, possibly aiding the maneuvers to free the shoulder.   There are 2 things you MUST do to have a waterbirth with [facility]: Attend a waterbirth class at [facility] at [facility]   3rd Wednesday of every month from 7-9 pm (virtual during COVID) [employer] at [email or web address] or [email or web address] or by calling [phone] Bring [place] the certificate from the class to your prenatal appointment or send via MyChart Meet with a midwife at 36 weeks* to see if you can still plan a waterbirth and to sign the consent.   *We also recommend that you schedule as many of your prenatal visits with a midwife as possible.    Helpful information: You may want to bring a bathing suit  top to the hospital to wear during labor but this is optional.  All other supplies are provided by the hospital. Please arrive at the hospital with signs of active labor, and do not wait at home until late in labor. It takes 45 min- 1 hour for fetal monitoring, and check in to your room to take place, plus transport and filling of the waterbirth tub.    Things that would prevent you from having a waterbirth: Premature, <37wks  Previous cesarean birth  Presence of thick meconium-stained fluid  Multiple gestation (Twins, triplets, etc.)  Uncontrolled diabetes or gestational diabetes requiring medication  Hypertension diagnosed in pregnancy or preexisting hypertension (gestational hypertension, preeclampsia, or chronic hypertension) Fetal growth restriction (your baby measures less than 10th percentile on ultrasound) Heavy vaginal bleeding  Non-reassuring fetal heart rate  Active infection (MRSA, etc.). Group B Strep is NOT a contraindication for waterbirth.  If your labor has to be induced and induction method requires continuous monitoring of the baby's heart rate  Other risks/issues identified by your obstetrical provider   Please remember that birth is unpredictable. Under certain unforeseeable circumstances your provider may advise against giving birth in the tub. These decisions will be made on a case-by-case basis and with the safety of you and your baby as our highest priority.    Updated 10/15/21 We highly recommend childbirth education to help you plan for labor and begin practicing coping skills (which will be needed with or without pain meds).  [place] Childbirth Education Options: Sign up by visiting [email or web address]  Childbirth ~ Self-Paced eClass (English  and Spanish) This online class offers you the freedom to complete a childbirth education series in the comfort of your own home at your own pace.  Childbirth Class (In-Person 4-Week Series  or on Saturdays, Virtual  4-Week Series ~ [place]) This interactive in-person class series will help you and your partner prepare for your birth experience. Topics include: Labor & Birth, Comfort Measures, Breathing Techniques, Massage, Medical Interventions, Pain Management Options, Cesarean Birth, Postpartum Care, and Newborn Care  Comfort Techniques for Labor ~ In-Person Class [facility]) This interactive class is designed for parents-to-be who want to learn & practice hands-on skills to help relieve some of the discomfort of labor and encourage their babies to rotate toward the best position for birth. Moms and their partners will be able to try a variety of labor positions with birth balls and rebozos as well as practice breathing, relaxation, and visualization techniques.  Natural Childbirth Class (In-Person 5-Week Series, In-Person on Saturdays or Virtual 5-Week Series ~ [place]) This class series is designed for expectant parents who want to learn and practice natural methods of coping with the process of labor and childbirth.  Cesarean Birth Self-Paced eClass (English and Spanish) This online course provides comprehensive information you can trust as you prepare for a possible cesarean birth. In this class, you'll learn how to make your birth and recovery comfortable and joyful through instructive video clips, animations, and activities.  Waterbirth ~ Airline pilot Interested in a waterbirth? In addition to a consultation with your credentialed waterbirth provider, this free, informational online class will help you discover whether waterbirth is the right fit for you. Not all obstetrical practices offer waterbirth, so check with your healthcare provider.  Tour Probation officer) - [facility] [employer] our 4 minute video tour of [facility] located in [place].   [place] Parenting Education Options:  Pregnancy 101 (Virtual) Congratulations on  your pregnancy! This class is geared toward moms in their first trimester, but everyone is welcome. We are excited to guide you through all aspects of supporting a healthy pregnancy. You will learn what to expect at routine prenatal care appointments, common postpartum adjustments, basic infant safety, and breastfeeding.  Successful Partnering & Parenting ~ In-Person Workshop [facility]) This workshop inspires and equips partners of all economic levels, ages, and cultures to confidently care for their infants, support the birthing persons, and navigate their own transformations into new partners and parents. Learning activities are geared towards supporting partner, but moms are welcome to attend.  'Baby & Me' Parenting Group (Virtual on Wednesdays at 11am) Enjoy this time discussing newborn & infant parenting topics and family adjustment issues with other new parents in a relaxed environment. Each week brings a new speaker or baby-centered activity. This group offers support and connection to parents as they journey through the adjustments and struggles of that sometimes overwhelming first year after the birth of a child.  Baby Safety, CPR, & Choking Class ~ Virtual This life-saving information is meant to encourage parents as they learn important safety and prevention tips as well as infant CPR and relief of choking.  Breastfeeding Class (In-Person in [place] or [employer]) Families learn what to expect in the first days and weeks of breastfeeding your newborn. IF YOU ARE AN EMPLOYEE TAKING THIS CLASS FOR CREDIT, DO NOT register yourself. Please e-mail [email or web address].   Breastfeeding Self-Paced eClass (English & Spanish) Families learn what to expect in the first days and weeks of breastfeeding your newborn.  Caring for Baby ~ In-Person, Virtual or Self-Paced Class This in-person class is for both expectant and adoptive parents who want to learn and practice the most up-to-date  newborn care for their babies. Focus is on birth through the first six weeks of life.  CPR & Choking Relief for Infants & Children ~ In-Person Class [facility]) This in-person course is designed for any parent, expectant parent, or adult who cares for infants or children. Participants learn and demonstrate cardiopulmonary resuscitation and choking relief procedures for both infants and children.  Grandparent Love ~ In-Person Class Grandparents will learn the most updated infant care and safety recommendations. They will discover ways to support their own children during the transition into the parenting role and receive tips on communicating with the new parents.  [place] Parenting Support Group Options:  Bereavement Grief Support Group (Pregnancy/Infant Loss) - Virtual This is an ongoing experience that meets once a month and is designed to help you honor the past, assist you in discovering tools to strengthen you today, and aid you in developing hope for the future.  Breastfeeding & Pumping Support Group (In-Person on Thursdays at 12pm or Virtual on Tuesdays at 5pm) Join [place] in-person each Thursday starting June 1st, 2023 at 12pm! This support group is free for all families looking for breastfeeding and/or pumping support.   Community-Based Childbirth Education Options:  [facility] Department Classes:  Childbirth education classes can help you get ready for a positive parenting experience. You can also meet other expectant parents and get free stuff for your baby. Each class runs for five weeks on the same night and costs $45 for the mother-to-be and her support person. Medicaid covers the cost if you are eligible. Call [phone] to register.  [facility] [employer] offers a variety of programs for the [employer] and is another great way to get connected. Please go to [email or web address] for more information.  Childbirth With A Twist! Be informed  of your options, get educated on birth, understand what your body is doing, learn how to cope, and have a lot of fun and laughs all while doing it either from the comfort of your couch OR in our cozy office and classroom space near the [place] airport. If you are taking a virtual class, then class is taught LIVE, so you can ask questions and receive answers in real-time from an experienced doula and childbirth educator.  This virtual childbirth education class will meet for five instruction times online.  Although we are based in [place], [place], this virtual class is open to anyone in the world. Please visit: [email or web address] for more information.  Books We Love: The Doula Guide to Childbirth by Harland German and Otila Back The First-Time Parent's Childbirth Handbook by Dr. Amie Critchley, CNM The Birth Partner by Truddie Crumble

## 2023-02-19 NOTE — Progress Notes (Signed)
   Subjective:  Becky Strickland is a 32 y.o. G3P0020 at [redacted]w[redacted]d being seen today for ongoing prenatal care.  She is currently monitored for the following issues for this low-risk pregnancy and has Supervision of low-risk pregnancy; Alpha thalassemia silent carrier; Fibroid uterus; and Encounter for repeat ultrasound of fetal pyelectasis in singleton pregnancy, antepartum on their problem list.  Patient reports no complaints.  Contractions: Not present. Vag. Bleeding: None.  Movement: Present. Denies leaking of fluid.   The following portions of the patient's history were reviewed and updated as appropriate: allergies, current medications, past family history, past medical history, past social history, past surgical history and problem list. Problem list updated.  Objective:   Vitals:   02/19/23 0844  BP: 97/70  Pulse: 98  Weight: 153 lb 3.2 oz (69.5 kg)    Fetal Status: Fetal Heart Rate (bpm): 145   Movement: Present     General:  Alert, oriented and cooperative. Patient is in no acute distress.  Skin: Skin is warm and dry. No rash noted.   Cardiovascular: Normal heart rate noted  Respiratory: Normal respiratory effort, no problems with respiration noted  Abdomen: Soft, gravid, appropriate for gestational age. Pain/Pressure: Absent     Pelvic: Vag. Bleeding: None     Cervical exam deferred        Extremities: Normal range of motion.  Edema: None  Mental Status: Normal mood and affect. Normal behavior. Normal judgment and thought content.   Urinalysis:      Assessment and Plan:  Pregnancy: G3P0020 at [redacted]w[redacted]d 1. Encounter for supervision of low-risk pregnancy in second trimester BP and FHR normal Feeling regular fetal movement FH appropriate  2. Intramural leiomyoma of uterus 7/11 u/s multiple fibroids, serial growths q4-6wks Bilat UTD, normal afi  Has u/s today   3. [redacted] weeks gestation of pregnancy Had questions about homebirth, water birth, breech position. Provided information  on what our practice offers. Discussed water birth and criteria to have a water birth, must take class, send certificate, meet with water birth certified provider Alvester Morin), and be low risk. Information for class provided, is meeting with Dr. Alvester Morin next visit.  Discussed breech position very normal at this stage, will reassess at 36 weeks and have options if still breech   Preterm labor symptoms and general obstetric precautions including but not limited to vaginal bleeding, contractions, leaking of fluid and fetal movement were reviewed in detail with the patient. Please refer to After Visit Summary for other counseling recommendations.  Future Appointments  Date Time Provider Department Center  02/19/2023  3:30 PM [facility] NURSE [facility]  02/19/2023  3:45 PM [facility] US4 [facility]  03/03/2023  8:15 AM Federico Flake, MD [facility]  03/18/2023  9:15 AM Venora Maples, MD [facility]  04/02/2023  9:15 AM Venora Maples, MD [facility]    Sue Lush, FNP

## 2023-02-22 ENCOUNTER — Other Ambulatory Visit: Payer: Self-pay | Admitting: Family Medicine

## 2023-02-22 MED ORDER — FERRIC MALTOL 30 MG PO CAPS: 1.0000 | ORAL_CAPSULE | Freq: Two times a day (BID) | ORAL | 9 refills | Status: DC

## 2023-03-03 ENCOUNTER — Other Ambulatory Visit: Payer: Self-pay

## 2023-03-03 ENCOUNTER — Ambulatory Visit (INDEPENDENT_AMBULATORY_CARE_PROVIDER_SITE_OTHER): Payer: Medicaid Other | Admitting: Family Medicine

## 2023-03-03 VITALS — BP 102/72 | HR 85 | Wt 153.4 lb

## 2023-03-03 DIAGNOSIS — Z3493 Encounter for supervision of normal pregnancy, unspecified, third trimester: Secondary | ICD-10-CM

## 2023-03-03 DIAGNOSIS — Z3A29 29 weeks gestation of pregnancy: Secondary | ICD-10-CM

## 2023-03-03 DIAGNOSIS — O35EXX0 Maternal care for other (suspected) fetal abnormality and damage, fetal genitourinary anomalies, not applicable or unspecified: Secondary | ICD-10-CM

## 2023-03-03 NOTE — Progress Notes (Signed)
   PRENATAL VISIT NOTE  Subjective:  Becky Strickland is a 32 y.o. G3P0020 at [redacted]w[redacted]d being seen today for ongoing prenatal care.  She is currently monitored for the following issues for this low-risk pregnancy and has Supervision of low-risk pregnancy; Alpha thalassemia silent carrier; Fibroid uterus; and Encounter for repeat ultrasound of fetal pyelectasis in singleton pregnancy, antepartum on their problem list.  Patient reports no complaints.  Contractions: Not present. Vag. Bleeding: None.  Movement: Present. Denies leaking of fluid.   The following portions of the patient's history were reviewed and updated as appropriate: allergies, current medications, past family history, past medical history, past social history, past surgical history and problem list.   Objective:   Vitals:   03/03/23 0835  BP: 102/72  Pulse: 85  Weight: 153 lb 6.4 oz (69.6 kg)    Fetal Status: Fetal Heart Rate (bpm): 138   Movement: Present     General:  Alert, oriented and cooperative. Patient is in no acute distress.  Skin: Skin is warm and dry. No rash noted.   Cardiovascular: Normal heart rate noted  Respiratory: Normal respiratory effort, no problems with respiration noted  Abdomen: Soft, gravid, appropriate for gestational age.  Pain/Pressure: Absent     Pelvic: Cervical exam deferred        Extremities: Normal range of motion.     Mental Status: Normal mood and affect. Normal behavior. Normal judgment and thought content.   Assessment and Plan:  Pregnancy: G3P0020 at [redacted]w[redacted]d 1. Encounter for supervision of low-risk pregnancy in third trimester - Pt is interested in waterbirth.  No contraindications at this time per chart review/patient assessment.   - Pt to enroll in class-- scheduled for 9/5 - Discussed waterbirth as option for low-risk pregnancy.  Reviewed conditions that may arise during pregnancy that will risk pt out of waterbirth including hypertension, diabetes, fetal growth restriction <10%ile,  etc. - FH appropriate  2. Encounter for repeat ultrasound of fetal pyelectasis in singleton pregnancy, antepartum   Preterm labor symptoms and general obstetric precautions including but not limited to vaginal bleeding, contractions, leaking of fluid and fetal movement were reviewed in detail with the patient. Please refer to After Visit Summary for other counseling recommendations.   Return in about 2 weeks (around 03/17/2023) for Routine prenatal care, Mom+Baby Combined Care.  Future Appointments  Date Time Provider Department Center  03/18/2023  9:15 AM Venora Maples, MD [facility]  04/02/2023  9:15 AM Venora Maples, MD [facility]  04/16/2023  3:30 PM [facility] US1 [facility]    Federico Flake, MD

## 2023-03-13 ENCOUNTER — Inpatient Hospital Stay (HOSPITAL_COMMUNITY)
Admission: AD | Admit: 2023-03-13 | Discharge: 2023-03-13 | Disposition: A | Discharge status: Home / Self Care | Payer: Medicaid Other | Attending: Obstetrics and Gynecology | Admitting: Obstetrics and Gynecology

## 2023-03-13 ENCOUNTER — Encounter (HOSPITAL_COMMUNITY): Payer: Self-pay | Admitting: Obstetrics and Gynecology

## 2023-03-13 DIAGNOSIS — Z3A31 31 weeks gestation of pregnancy: Secondary | ICD-10-CM | POA: Insufficient documentation

## 2023-03-13 DIAGNOSIS — B9689 Other specified bacterial agents as the cause of diseases classified elsewhere: Secondary | ICD-10-CM | POA: Insufficient documentation

## 2023-03-13 DIAGNOSIS — O98813 Other maternal infectious and parasitic diseases complicating pregnancy, third trimester: Secondary | ICD-10-CM | POA: Diagnosis not present

## 2023-03-13 DIAGNOSIS — Z3689 Encounter for other specified antenatal screening: Secondary | ICD-10-CM | POA: Insufficient documentation

## 2023-03-13 DIAGNOSIS — O98819 Other maternal infectious and parasitic diseases complicating pregnancy, unspecified trimester: Secondary | ICD-10-CM

## 2023-03-13 DIAGNOSIS — O23593 Infection of other part of genital tract in pregnancy, third trimester: Secondary | ICD-10-CM | POA: Insufficient documentation

## 2023-03-13 DIAGNOSIS — B3731 Acute candidiasis of vulva and vagina: Secondary | ICD-10-CM | POA: Diagnosis not present

## 2023-03-13 MED ORDER — TERCONAZOLE 0.4 % VA CREA: 1.0000 | TOPICAL_CREAM | Freq: Every day | VAGINAL | 0 refills | Status: DC

## 2023-03-13 NOTE — MAU Provider Note (Cosign Needed Addendum)
History     CSN: [id number]  Arrival date and time: 03/13/23 0102   Event Date/Time   First Provider Initiated Contact with Patient 03/13/23 0215      Chief Complaint  Patient presents with   Vaginal Bleeding   Rupture of Membranes    [name] is a 32 y.o. G3P0020 at [redacted]w[redacted]d who receives care at [facility].  She presents today for vaginal discharge concerning form ROM.  Patient states she was sitting around 1230 and started experiencing "a sting" in the vaginal area.  She states she then felt "an abnormal amount of liquid."  She states the wetness saturated through her underwear. She reports she went to the bathroom and noted pinkish watery discharge.  She states upon arrival she continues to have the watery discharge, but the color is clear. She denies abdominal pain or discharge prior to this incident.   Patient endorses fetal movement.    OB History     Gravida  3   Para      Term      Preterm      AB  2   Living         SAB  2   IAB  0   Ectopic      Multiple      Live Births              Past Medical History:  Diagnosis Date   Medical history non-contributory     Past Surgical History:  Procedure Laterality Date   NO PAST SURGERIES      Family History  Problem Relation Age of Onset   Fibroids Mother    Healthy Father    Fibroids Maternal Aunt    Fibroids Maternal Grandmother     Social History   Tobacco Use   Smoking status: Never   Smokeless tobacco: Never  Vaping Use   Vaping status: Never Used  Substance Use Topics   Alcohol use: Not Currently    Comment: occasionally   Drug use: No    Allergies: No Known Allergies  Medications Prior to Admission  Medication Sig Dispense Refill Last Dose   Ferric Maltol 30 MG CAPS Take 1 capsule (30 mg total) by mouth in the morning and at bedtime. 60 capsule 9 Past Week   Prenatal Vit-Fe Fumarate-FA (PRENATAL VITAMINS) 28-0.8 MG TABS Take 1 tablet by mouth daily. 30 tablet 11 03/12/2023    aspirin EC 81 MG tablet Take 1 tablet (81 mg total) by mouth daily. Swallow whole. (Patient not taking: Reported on 01/12/2023) 30 tablet 12     Review of Systems  Gastrointestinal:  Positive for constipation (Today, taking miralax). Negative for abdominal pain, diarrhea, nausea and vomiting.  Genitourinary:  Positive for vaginal discharge. Negative for difficulty urinating and dysuria.   Physical Exam   Blood pressure 108/66, pulse 91, temperature 98.2 F (36.8 C), temperature source Oral, resp. rate 15, height 5\' 2"  (1.575 m), weight 71.4 kg, last menstrual period 08/07/2022, SpO2 98%.  Physical Exam Vitals reviewed. Exam conducted with a chaperone present [facility], Charity fundraiser).  Constitutional:      Appearance: Normal appearance.  HENT:     Head: Normocephalic and atraumatic.  Eyes:     Conjunctiva/sclera: Conjunctivae normal.  Cardiovascular:     Rate and Rhythm: Normal rate.  Pulmonary:     Effort: Pulmonary effort is normal.  Genitourinary:    Comments: Speculum Exam: -External Genitalia appears pink and chafed. Small amt white curdy discharge  noted -Vaginal Vault: Pink mucosa with good rugae. Moderate amt thick white curdy discharge  -Cervix:Pink, no lesions, cysts, or polyps.  Appears closed. No active bleeding from os -Bimanual Exam:  Deferred Musculoskeletal:        General: Normal range of motion.     Cervical back: Normal range of motion.  Skin:    General: Skin is warm and dry.  Neurological:     Mental Status: She is alert and oriented to person, place, and time.  Psychiatric:        Mood and Affect: Mood normal.        Behavior: Behavior normal.     Fetal Assessment 150 bpm, Mod Var, -Decels, +Accels Toco: No ctx graphed  MAU Course  No results found for this or any previous visit (from the past 24 hour(s)). No results found.  MDM PE Labs: None EFM Prescription Assessment and Plan  32 year old G3P0020  SIUP at 31.1 weeks Cat I FT Candidiasis of  Vagina  -POC Reviewed -Exam performed and findings discussed. -Informed that findings are diagnostic for yeast infection. -Reassured that no ROM and cervix visually closed. -Discussed treatment with vaginal cream. -Patient without questions. -NST reactive. -Encouraged to call primary office or return to MAU if symptoms worsen or with the onset of new symptoms. -Rx sent to pharmacy on file.  -Discharged to home in stable condition.  Cherre Robins MSN, CNM 03/13/2023, 2:16 AM

## 2023-03-13 NOTE — MAU Note (Signed)
..  Becky Strickland is a 32 y.o. at [redacted]w[redacted]d here in MAU reporting:  Vaginal pain when she sat down earlier, felt a small gush and saw she had pink discharge.  +FM.  Denies contractions.  Last intercourse: not within the last week Pain score: 2/10 Vitals:   03/13/23 0124  BP: 111/64  Pulse: 95  Resp: 15  Temp: 98.2 F (36.8 C)  SpO2: 98%     FHT:150 Lab orders placed from triage:  UA

## 2023-03-18 ENCOUNTER — Encounter: Payer: Self-pay | Admitting: Family Medicine

## 2023-03-18 ENCOUNTER — Telehealth (INDEPENDENT_AMBULATORY_CARE_PROVIDER_SITE_OTHER): Payer: Medicaid Other | Admitting: Family Medicine

## 2023-03-18 VITALS — BP 120/76 | HR 79 | Wt 157.0 lb

## 2023-03-18 DIAGNOSIS — Z3A31 31 weeks gestation of pregnancy: Secondary | ICD-10-CM

## 2023-03-18 DIAGNOSIS — O35EXX0 Maternal care for other (suspected) fetal abnormality and damage, fetal genitourinary anomalies, not applicable or unspecified: Secondary | ICD-10-CM

## 2023-03-18 DIAGNOSIS — O3413 Maternal care for benign tumor of corpus uteri, third trimester: Secondary | ICD-10-CM

## 2023-03-18 DIAGNOSIS — Z3493 Encounter for supervision of normal pregnancy, unspecified, third trimester: Secondary | ICD-10-CM

## 2023-03-18 DIAGNOSIS — D251 Intramural leiomyoma of uterus: Secondary | ICD-10-CM

## 2023-03-18 NOTE — Progress Notes (Signed)
I connected with Becky Strickland 03/18/23 at 11:15 AM EDT by: MyChart video and verified that I am speaking with the correct person using two identifiers.  Patient is located at home and provider is located at [facility].     I discussed the limitations, risks, security and privacy concerns of performing an evaluation and management service by MyChart video and the availability of in person appointments. I also discussed with the patient that there may be a patient responsible charge related to this service. By engaging in this virtual visit, you consent to the provision of healthcare.  Additionally, you authorize for your insurance to be billed for the services provided during this visit.  The patient expressed understanding and agreed to proceed.  The following staff members participated in the virtual visit:  Venora Maples     PRENATAL VISIT NOTE  Subjective:  Becky Strickland is a 32 y.o. G3P0020 at [redacted]w[redacted]d  for virtual video visit for ongoing prenatal care.  She is currently monitored for the following issues for this low-risk pregnancy and has Supervision of low-risk pregnancy; Alpha thalassemia silent carrier; and Fibroid uterus on their problem list.  Patient reports no complaints.  Contractions: Not present. Vag. Bleeding: None.  Movement: Present. Denies leaking of fluid.   The following portions of the patient's history were reviewed and updated as appropriate: allergies, current medications, past family history, past medical history, past social history, past surgical history and problem list.   Objective:   Vitals:   03/18/23 1117  BP: 120/76  Pulse: 79  Weight: 157 lb (71.2 kg)   Self-Obtained  Fetal Status:     Movement: Present     Assessment and Plan:  Pregnancy: G3P0020 at [redacted]w[redacted]d 1. Encounter for supervision of low-risk pregnancy in third trimester BP normal Reports good fetal movement Interested in water birth, taking class tonight  2. Intramural  leiomyoma of uterus Following w MFM, normal growth to date  3. Encounter for repeat ultrasound of fetal pyelectasis in singleton pregnancy, antepartum Resolved on most recent US from 02/19/23 Resolved from problem list  Preterm labor symptoms and general obstetric precautions including but not limited to vaginal bleeding, contractions, leaking of fluid and fetal movement were reviewed in detail with the patient.  Return in 2 weeks (on 04/01/2023) for Dyad patient, ob visit.  Future Appointments  Date Time Provider Department Center  04/02/2023  9:15 AM Venora Maples, MD [facility]  04/16/2023  3:30 PM [facility] US1 [facility]     Time spent on virtual visit: 5 minutes  Venora Maples, MD

## 2023-03-18 NOTE — Patient Instructions (Signed)

## 2023-04-02 ENCOUNTER — Ambulatory Visit (INDEPENDENT_AMBULATORY_CARE_PROVIDER_SITE_OTHER): Payer: Medicaid Other | Admitting: Family Medicine

## 2023-04-02 ENCOUNTER — Other Ambulatory Visit: Payer: Self-pay

## 2023-04-02 VITALS — BP 99/73 | HR 98 | Wt 158.8 lb

## 2023-04-02 DIAGNOSIS — Z3493 Encounter for supervision of normal pregnancy, unspecified, third trimester: Secondary | ICD-10-CM

## 2023-04-02 DIAGNOSIS — D251 Intramural leiomyoma of uterus: Secondary | ICD-10-CM

## 2023-04-02 NOTE — Patient Instructions (Signed)

## 2023-04-02 NOTE — Progress Notes (Signed)
   Subjective:  Becky Strickland is a 32 y.o. G3P0020 at [redacted]w[redacted]d being seen today for ongoing prenatal care.  She is currently monitored for the following issues for this low-risk pregnancy and has Supervision of low-risk pregnancy; Alpha thalassemia silent carrier; and Fibroid uterus on their problem list.  Patient reports no complaints.  Contractions: Not present. Vag. Bleeding: None.  Movement: Present. Denies leaking of fluid.   The following portions of the patient's history were reviewed and updated as appropriate: allergies, current medications, past family history, past medical history, past social history, past surgical history and problem list. Problem list updated.  Objective:   Vitals:   04/02/23 0911  BP: 99/73  Pulse: 98  Weight: 158 lb 12.8 oz (72 kg)    Fetal Status: Fetal Heart Rate (bpm): 142 Fundal Height: 33 cm Movement: Present     General:  Alert, oriented and cooperative. Patient is in no acute distress.  Skin: Skin is warm and dry. No rash noted.   Cardiovascular: Normal heart rate noted  Respiratory: Normal respiratory effort, no problems with respiration noted  Abdomen: Soft, gravid, appropriate for gestational age. Pain/Pressure: Absent     Pelvic: Vag. Bleeding: None     Cervical exam deferred        Extremities: Normal range of motion.  Edema: None  Mental Status: Normal mood and affect. Normal behavior. Normal judgment and thought content.   Urinalysis:      Assessment and Plan:  Pregnancy: G3P0020 at [redacted]w[redacted]d  1. Encounter for supervision of low-risk pregnancy in third trimester BP and FHR normal FH appropriate Discussed swabs for next visit Reports she does not want students involved in her labor, discussed CNM students and she is ok with them observing, added to pink sticky Brings water birth certificate, has completed the class, and still planning on doing water birth  2. Intramural leiomyoma of uterus Following w MFM, has [place] scheduled for  04/16/2023  Preterm labor symptoms and general obstetric precautions including but not limited to vaginal bleeding, contractions, leaking of fluid and fetal movement were reviewed in detail with the patient. Please refer to After Visit Summary for other counseling recommendations.  Return in 2 weeks (on 04/16/2023) for Dyad patient, ob visit.   Venora Maples, MD

## 2023-04-16 ENCOUNTER — Ambulatory Visit: Payer: Medicaid Other | Attending: Maternal & Fetal Medicine

## 2023-04-16 ENCOUNTER — Ambulatory Visit: Payer: Medicaid Other

## 2023-04-16 DIAGNOSIS — Z3A36 36 weeks gestation of pregnancy: Secondary | ICD-10-CM

## 2023-04-16 DIAGNOSIS — D259 Leiomyoma of uterus, unspecified: Secondary | ICD-10-CM | POA: Insufficient documentation

## 2023-04-16 DIAGNOSIS — O285 Abnormal chromosomal and genetic finding on antenatal screening of mother: Secondary | ICD-10-CM | POA: Diagnosis not present

## 2023-04-16 DIAGNOSIS — D563 Thalassemia minor: Secondary | ICD-10-CM

## 2023-04-16 DIAGNOSIS — O3413 Maternal care for benign tumor of corpus uteri, third trimester: Secondary | ICD-10-CM | POA: Insufficient documentation

## 2023-04-19 ENCOUNTER — Other Ambulatory Visit (HOSPITAL_COMMUNITY)
Admission: RE | Admit: 2023-04-19 | Discharge: 2023-04-19 | Disposition: A | Discharge status: Home / Self Care | Payer: Medicaid Other | Source: Ambulatory Visit | Attending: Family Medicine | Admitting: Family Medicine

## 2023-04-19 ENCOUNTER — Ambulatory Visit (INDEPENDENT_AMBULATORY_CARE_PROVIDER_SITE_OTHER): Payer: Medicaid Other | Admitting: Family Medicine

## 2023-04-19 ENCOUNTER — Other Ambulatory Visit: Payer: Self-pay

## 2023-04-19 VITALS — BP 110/78 | HR 94 | Wt 160.0 lb

## 2023-04-19 DIAGNOSIS — Z3493 Encounter for supervision of normal pregnancy, unspecified, third trimester: Secondary | ICD-10-CM | POA: Insufficient documentation

## 2023-04-19 DIAGNOSIS — Z3A36 36 weeks gestation of pregnancy: Secondary | ICD-10-CM | POA: Diagnosis not present

## 2023-04-19 DIAGNOSIS — D251 Intramural leiomyoma of uterus: Secondary | ICD-10-CM

## 2023-04-19 DIAGNOSIS — D563 Thalassemia minor: Secondary | ICD-10-CM

## 2023-04-19 NOTE — Progress Notes (Signed)
   PRENATAL VISIT NOTE  Subjective:  Becky Strickland is a 32 y.o. G3P0020 at [redacted]w[redacted]d being seen today for ongoing prenatal care.  She is currently monitored for the following issues for this low-risk pregnancy and has Supervision of low-risk pregnancy; Alpha thalassemia silent carrier; and Fibroid uterus on their problem list.  Patient reports no complaints.  Contractions: Irritability. Vag. Bleeding: None.  Movement: Present. Denies leaking of fluid.   The following portions of the patient's history were reviewed and updated as appropriate: allergies, current medications, past family history, past medical history, past social history, past surgical history and problem list.   Objective:   Vitals:   04/19/23 0844  BP: 110/78  Pulse: 94  Weight: 160 lb (72.6 kg)    Fetal Status: Fetal Heart Rate (bpm): 136   Movement: Present     General:  Alert, oriented and cooperative. Patient is in no acute distress.  Skin: Skin is warm and dry. No rash noted.   Cardiovascular: Normal heart rate noted  Respiratory: Normal respiratory effort, no problems with respiration noted  Abdomen: Soft, gravid, appropriate for gestational age.  Pain/Pressure: Present     Pelvic: Cervical exam deferred        Extremities: Normal range of motion.  Edema: Trace  Mental Status: Normal mood and affect. Normal behavior. Normal judgment and thought content.   Assessment and Plan:  Pregnancy: G3P0020 at [redacted]w[redacted]d 1. Encounter for supervision of low-risk pregnancy in third trimester Continues to be interested in waterbirth- took class and certificate is in media section. Reviewed process for WB and answered any questions about birth in water. Encouraged natural childbirth class/education to help with coping strategies. Does not have any contraindication for WB at this time. Reviewed that blood pressure, bleeding, preterm labor cannot be ruled out. Reviewed the WB or other provider will always review at the time of labor to  assure safety to birth in water.  Patient asked about possibilities for pain meds with WB-- reviewed the epidural is contraindicated but can get IV meds and nitrous but will need to be out of the water to receive and then could get back into water  - Culture, beta strep (group b only) - GC/Chlamydia probe amp ([place])not at [facility]  2. [redacted] weeks gestation of pregnancy - Culture, beta strep (group b only) - GC/Chlamydia probe amp ([place])not at [facility]  3. Alpha thalassemia silent carrier  4. Intramural leiomyoma of uterus   Preterm labor symptoms and general obstetric precautions including but not limited to vaginal bleeding, contractions, leaking of fluid and fetal movement were reviewed in detail with the patient. Please refer to After Visit Summary for other counseling recommendations.   Return in about 1 week (around 04/26/2023) for Routine prenatal care.  Future Appointments  Date Time Provider Department Center  04/26/2023  8:55 AM Federico Flake, MD [facility]  05/03/2023  8:55 AM Federico Flake, MD [facility]  05/10/2023  9:15 AM Celedonio Savage, MD [facility]    Federico Flake, MD

## 2023-04-20 LAB — GC/CHLAMYDIA PROBE AMP (~~LOC~~) NOT AT ARMC
Chlamydia: NEGATIVE
Comment: NEGATIVE
Comment: NORMAL
Neisseria Gonorrhea: NEGATIVE

## 2023-04-22 LAB — CULTURE, BETA STREP (GROUP B ONLY): Strep Gp B Culture: NEGATIVE

## 2023-04-26 ENCOUNTER — Ambulatory Visit (INDEPENDENT_AMBULATORY_CARE_PROVIDER_SITE_OTHER): Payer: Medicaid Other | Admitting: Family Medicine

## 2023-04-26 ENCOUNTER — Encounter: Payer: Self-pay | Admitting: Family Medicine

## 2023-04-26 ENCOUNTER — Other Ambulatory Visit: Payer: Self-pay

## 2023-04-26 VITALS — BP 102/71 | HR 92 | Wt 160.8 lb

## 2023-04-26 DIAGNOSIS — D251 Intramural leiomyoma of uterus: Secondary | ICD-10-CM

## 2023-04-26 DIAGNOSIS — D563 Thalassemia minor: Secondary | ICD-10-CM

## 2023-04-26 DIAGNOSIS — Z3493 Encounter for supervision of normal pregnancy, unspecified, third trimester: Secondary | ICD-10-CM | POA: Diagnosis not present

## 2023-04-26 NOTE — Progress Notes (Signed)
   PRENATAL VISIT NOTE  Subjective:  Becky Strickland is a 32 y.o. G3P0020 at [redacted]w[redacted]d being seen today for ongoing prenatal care.  She is currently monitored for the following issues for this low-risk pregnancy and has Supervision of low-risk pregnancy; Alpha thalassemia silent carrier; and Fibroid uterus on their problem list.  Patient reports no complaints.  Contractions: Irritability. Vag. Bleeding: None.  Movement: Present. Denies leaking of fluid.   The following portions of the patient's history were reviewed and updated as appropriate: allergies, current medications, past family history, past medical history, past social history, past surgical history and problem list.   Objective:   Vitals:   04/26/23 0854  BP: 102/71  Pulse: 92  Weight: 160 lb 12.8 oz (72.9 kg)    Fetal Status: Fetal Heart Rate (bpm): 140   Movement: Present     General:  Alert, oriented and cooperative. Patient is in no acute distress.  Skin: Skin is warm and dry. No rash noted.   Cardiovascular: Normal heart rate noted  Respiratory: Normal respiratory effort, no problems with respiration noted  Abdomen: Soft, gravid, appropriate for gestational age.  Pain/Pressure: Present     Pelvic: Cervical exam deferred        Extremities: Normal range of motion.  Edema: None  Mental Status: Normal mood and affect. Normal behavior. Normal judgment and thought content.   Assessment and Plan:  Pregnancy: G3P0020 at [redacted]w[redacted]d 1. Intramural leiomyoma of uterus  2. Encounter for supervision of low-risk pregnancy in third trimester No concerns or questions today Wondering about ways to start labor- she is eating dates, has heard about midwives brew, miles circuit Gave some instructions on supplements Recommended not using castor oil until after EDD Confirmed plan for WB  Reviewed membrane sweeping at 39 weeks  3. Alpha thalassemia silent carrier   Preterm labor symptoms and general obstetric precautions including but  not limited to vaginal bleeding, contractions, leaking of fluid and fetal movement were reviewed in detail with the patient. Please refer to After Visit Summary for other counseling recommendations.   Return in about 1 week (around 05/03/2023) for Routine prenatal care, Mom+Baby Combined Care.  Future Appointments  Date Time Provider Department Center  05/03/2023  8:55 AM Federico Flake, MD [facility]  05/10/2023  9:15 AM Celedonio Savage, MD [facility]  05/19/2023  9:15 AM [facility] US2 [facility]  05/19/2023  9:55 AM Sue Lush, FNP [facility]    Federico Flake, MD

## 2023-04-26 NOTE — Patient Instructions (Addendum)
You were asking about ways to help labor start.   Marvis [employer] Circuit- this is a series of stretches that help align the baby in your uterus Movement- spend time in a a hands and knees position, walking, using a birthing ball or doing prenatal yoga.  Breast pumping-- you can pump for 15 minutes on one side to help start contractions Orgasm-- having an orgasm releases a hormone called oxytocin which causes contractions  ___________________________________________________  To help ripen your Cervix/prepare the uterus (to get your cervix ready for labor):   Red Raspberry Leaf capsules:  two 300mg  or 400mg  tablets with each meal, 2-3 times a day  Potential Side Effects Of Raspberry Leaf:  Most women do not experience any side effects from drinking raspberry leaf tea. However, nausea and loose stools are possible. This can cause uterine irritability. If you notice having many braxton hicks contractions, stop this supplement.    Evening Primrose Oil capsules: may take 1 to 3 capsules daily. May also prick one to release the oil and insert it into your vagina at night.  One regimen would be to take 1 tablets three times per day and place 2 tablets in the vagina at night.   Some of the potential side effects:  Upset stomach  Loose stools or diarrhea  Headaches  Nausea   _____________________________________________________________________________________  For Labor: All of these can be use in the last trimester of pregnancy  5-6 Dates a day -- This can help shorten your labor (may taste better if warmed in microwave until soft). This can also be combined with almond butter, any other nut butter, or wrap in bacon and bake, or toss in smoothies. Can also eat [place] bars. Found where raisins are in the grocery store   _____________________________________________________________________________________

## 2023-05-03 ENCOUNTER — Other Ambulatory Visit: Payer: Self-pay

## 2023-05-03 ENCOUNTER — Ambulatory Visit (INDEPENDENT_AMBULATORY_CARE_PROVIDER_SITE_OTHER): Payer: Medicaid Other | Admitting: Family Medicine

## 2023-05-03 VITALS — BP 104/72 | HR 88 | Wt 162.2 lb

## 2023-05-03 DIAGNOSIS — Z3493 Encounter for supervision of normal pregnancy, unspecified, third trimester: Secondary | ICD-10-CM | POA: Diagnosis not present

## 2023-05-03 MED ORDER — FERRIC MALTOL 30 MG PO CAPS: 1.0000 | ORAL_CAPSULE | Freq: Two times a day (BID) | ORAL | 9 refills | Status: DC

## 2023-05-03 NOTE — Progress Notes (Signed)
   PRENATAL VISIT NOTE  Subjective:  Becky Strickland is a 32 y.o. G3P0020 at [redacted]w[redacted]d being seen today for ongoing prenatal care.  She is currently monitored for the following issues for this low-risk pregnancy and has Supervision of low-risk pregnancy; Alpha thalassemia silent carrier; and Fibroid uterus on their problem list.  Patient reports no complaints.  Contractions: Irritability. Vag. Bleeding: None.  Movement: Present. Denies leaking of fluid.   The following portions of the patient's history were reviewed and updated as appropriate: allergies, current medications, past family history, past medical history, past social history, past surgical history and problem list.   Objective:   Vitals:   05/03/23 0847  BP: 104/72  Pulse: 88  Weight: 162 lb 3.2 oz (73.6 kg)    Fetal Status: Fetal Heart Rate (bpm): 158   Movement: Present     General:  Alert, oriented and cooperative. Patient is in no acute distress.  Skin: Skin is warm and dry. No rash noted.   Cardiovascular: Normal heart rate noted  Respiratory: Normal respiratory effort, no problems with respiration noted  Abdomen: Soft, gravid, appropriate for gestational age.  Pain/Pressure: Present     Pelvic: Cervical exam deferred        Extremities: Normal range of motion.     Mental Status: Normal mood and affect. Normal behavior. Normal judgment and thought content.   Assessment and Plan:  Pregnancy: G3P0020 at [redacted]w[redacted]d  1. Encounter for supervision of low-risk pregnancy in third trimester Checked last week, was 1 cm Discussed IOL at 41 weeks and wants this at exactly 41wk, prefers midnight. Discussed outpatient FB - she is unsure at his point Partner asked about WB if she is induced and discussed FB, cytotec, AROM, pumping and even brief pitocin as methods to help preserve WB option. Discussed that if she needs to be on continuous pitocin to given regular contractions this would require continuous monitoring and WB would not be  an option.    Term labor symptoms and general obstetric precautions including but not limited to vaginal bleeding, contractions, leaking of fluid and fetal movement were reviewed in detail with the patient. Please refer to After Visit Summary for other counseling recommendations.   Return in about 1 week (around 05/10/2023) for Mom+Baby Combined Care.  Future Appointments  Date Time Provider Department Center  05/10/2023  9:15 AM Celedonio Savage, MD [facility]  05/19/2023  9:15 AM [facility] US2 [facility]  05/19/2023  9:55 AM Sue Lush, FNP [facility]    Federico Flake, MD

## 2023-05-10 ENCOUNTER — Other Ambulatory Visit: Payer: Self-pay

## 2023-05-10 ENCOUNTER — Ambulatory Visit (INDEPENDENT_AMBULATORY_CARE_PROVIDER_SITE_OTHER): Payer: Medicaid Other | Admitting: Family Medicine

## 2023-05-10 VITALS — BP 104/75 | HR 111 | Wt 164.3 lb

## 2023-05-10 DIAGNOSIS — Z3493 Encounter for supervision of normal pregnancy, unspecified, third trimester: Secondary | ICD-10-CM | POA: Diagnosis not present

## 2023-05-10 DIAGNOSIS — D251 Intramural leiomyoma of uterus: Secondary | ICD-10-CM

## 2023-05-10 DIAGNOSIS — Z3A39 39 weeks gestation of pregnancy: Secondary | ICD-10-CM

## 2023-05-10 NOTE — Progress Notes (Signed)
   PRENATAL VISIT NOTE  Subjective:  Becky Strickland is a 32 y.o. G3P0020 at [redacted]w[redacted]d being seen today for ongoing prenatal care.  She is currently monitored for the following issues for this low-risk pregnancy and has Supervision of low-risk pregnancy; Alpha thalassemia silent carrier; and Fibroid uterus on their problem list.  Patient reports no bleeding, no contractions, no cramping, and no leaking.  Contractions: Not present. Vag. Bleeding: None.  Movement: Present. Denies leaking of fluid.   The following portions of the patient's history were reviewed and updated as appropriate: allergies, current medications, past family history, past medical history, past social history, past surgical history and problem list.   Objective:   Vitals:   05/10/23 0927  BP: 104/75  Pulse: (!) 111  Weight: 164 lb 4.8 oz (74.5 kg)    Fetal Status: Fetal Heart Rate (bpm): 145   Movement: Present     General:  Alert, oriented and cooperative. Patient is in no acute distress.  Skin: Skin is warm and dry. No rash noted.   Cardiovascular: Normal heart rate noted  Respiratory: Normal respiratory effort, no problems with respiration noted  Abdomen: Soft, gravid, appropriate for gestational age.  Pain/Pressure: Present     Pelvic: Cervical exam performed by female provider with chaperone present  Dilation: 1.5 Effacement (%): 30, 40 Station: Ballotable  Extremities: Normal range of motion.  Edema: None  Mental Status: Normal mood and affect. Normal behavior. Normal judgment and thought content.   Assessment and Plan:  Pregnancy: G3P0020 at [redacted]w[redacted]d 1. Encounter for supervision of low-risk pregnancy in third trimester Continue routine prenatal care Continue to plan for water birth. Female provider in the practice came and completed cervical exam with membrane sweep  2. Intramural leiomyoma of uterus  3. [redacted] weeks gestation of pregnancy Continue routine prenatal care Discussed OP Foley balloon prior to  postdates induction patient reports she will think about it   Term labor symptoms and general obstetric precautions including but not limited to vaginal bleeding, contractions, leaking of fluid and fetal movement were reviewed in detail with the patient. Please refer to After Visit Summary for other counseling recommendations.   No follow-ups on file.  Future Appointments  Date Time Provider Department Center  05/19/2023  9:15 AM [facility] US2 [facility]  05/19/2023  9:55 AM Sue Lush, FNP [facility]  05/21/2023 12:00 AM [facility] ROOM [facility] None    Celedonio Savage, MD

## 2023-05-10 NOTE — Progress Notes (Signed)
Cervical exam performed with chaperone at the bedside. Membranes swept. 1.5/30:40/Ballotable. Patient informed of assessment.

## 2023-05-14 ENCOUNTER — Telehealth (HOSPITAL_COMMUNITY): Payer: Self-pay | Admitting: *Deleted

## 2023-05-14 ENCOUNTER — Encounter (HOSPITAL_COMMUNITY): Payer: Self-pay | Admitting: *Deleted

## 2023-05-14 NOTE — Telephone Encounter (Signed)
Preadmission screen  

## 2023-05-16 ENCOUNTER — Inpatient Hospital Stay (HOSPITAL_COMMUNITY)
Admission: AD | Admit: 2023-05-16 | Discharge: 2023-05-19 | DRG: 805 | Disposition: A | Discharge status: Home / Self Care | Payer: Medicaid Other | Attending: Obstetrics and Gynecology | Admitting: Obstetrics and Gynecology

## 2023-05-16 ENCOUNTER — Other Ambulatory Visit: Payer: Self-pay

## 2023-05-16 ENCOUNTER — Inpatient Hospital Stay (HOSPITAL_COMMUNITY): Payer: Medicaid Other | Admitting: Anesthesiology

## 2023-05-16 ENCOUNTER — Encounter (HOSPITAL_COMMUNITY): Payer: Self-pay | Admitting: Obstetrics and Gynecology

## 2023-05-16 ENCOUNTER — Inpatient Hospital Stay (HOSPITAL_COMMUNITY): Payer: Medicaid Other

## 2023-05-16 DIAGNOSIS — O480 Post-term pregnancy: Secondary | ICD-10-CM

## 2023-05-16 DIAGNOSIS — O4103X0 Oligohydramnios, third trimester, not applicable or unspecified: Secondary | ICD-10-CM | POA: Diagnosis present

## 2023-05-16 DIAGNOSIS — D251 Intramural leiomyoma of uterus: Secondary | ICD-10-CM | POA: Diagnosis not present

## 2023-05-16 DIAGNOSIS — Z3A40 40 weeks gestation of pregnancy: Secondary | ICD-10-CM

## 2023-05-16 DIAGNOSIS — O411230 Chorioamnionitis, third trimester, not applicable or unspecified: Secondary | ICD-10-CM | POA: Diagnosis present

## 2023-05-16 DIAGNOSIS — Z148 Genetic carrier of other disease: Secondary | ICD-10-CM | POA: Diagnosis not present

## 2023-05-16 DIAGNOSIS — O99214 Obesity complicating childbirth: Secondary | ICD-10-CM | POA: Diagnosis not present

## 2023-05-16 DIAGNOSIS — O411290 Chorioamnionitis, unspecified trimester, not applicable or unspecified: Secondary | ICD-10-CM | POA: Insufficient documentation

## 2023-05-16 DIAGNOSIS — O3413 Maternal care for benign tumor of corpus uteri, third trimester: Secondary | ICD-10-CM | POA: Diagnosis not present

## 2023-05-16 DIAGNOSIS — K219 Gastro-esophageal reflux disease without esophagitis: Secondary | ICD-10-CM | POA: Diagnosis present

## 2023-05-16 DIAGNOSIS — Z3493 Encounter for supervision of normal pregnancy, unspecified, third trimester: Principal | ICD-10-CM

## 2023-05-16 DIAGNOSIS — O9962 Diseases of the digestive system complicating childbirth: Secondary | ICD-10-CM | POA: Diagnosis present

## 2023-05-16 DIAGNOSIS — O4290 Premature rupture of membranes, unspecified as to length of time between rupture and onset of labor, unspecified weeks of gestation: Secondary | ICD-10-CM | POA: Diagnosis present

## 2023-05-16 DIAGNOSIS — O9902 Anemia complicating childbirth: Secondary | ICD-10-CM | POA: Diagnosis present

## 2023-05-16 LAB — CBC
HCT: 38.9 % (ref 36.0–46.0)
Hemoglobin: 12.6 g/dL (ref 12.0–15.0)
MCH: 27.9 pg (ref 26.0–34.0)
MCHC: 32.4 g/dL (ref 30.0–36.0)
MCV: 86.1 fL (ref 80.0–100.0)
Platelets: 277 10*3/uL (ref 150–400)
RBC: 4.52 MIL/uL (ref 3.87–5.11)
RDW: 14.1 % (ref 11.5–15.5)
WBC: 8.2 10*3/uL (ref 4.0–10.5)
nRBC: 0 % (ref 0.0–0.2)

## 2023-05-16 LAB — TYPE AND SCREEN
ABO/RH(D): O POS
Antibody Screen: NEGATIVE

## 2023-05-16 MED ORDER — PHENYLEPHRINE 80 MCG/ML (10ML) SYRINGE FOR IV PUSH (FOR BLOOD PRESSURE SUPPORT)
80.0000 ug | PREFILLED_SYRINGE | INTRAVENOUS | Status: DC | PRN
Start: 2023-05-16 — End: 2023-05-17
  Filled 2023-05-16: qty 10

## 2023-05-16 MED ORDER — OXYTOCIN BOLUS FROM INFUSION
333.0000 mL | Freq: Once | INTRAVENOUS | Status: AC
  Administered 2023-05-17: 333 mL via INTRAVENOUS

## 2023-05-16 MED ORDER — FENTANYL-BUPIVACAINE-NACL 0.5-0.125-0.9 MG/250ML-% EP SOLN
12.0000 mL/h | EPIDURAL | Status: DC | PRN
  Administered 2023-05-16: 11 mL/h via EPIDURAL
  Administered 2023-05-17: 12 mL/h via EPIDURAL
  Filled 2023-05-16 (×2): qty 250

## 2023-05-16 MED ORDER — ACETAMINOPHEN 325 MG PO TABS
650.0000 mg | ORAL_TABLET | ORAL | Status: DC | PRN
  Administered 2023-05-17: 650 mg via ORAL
  Filled 2023-05-16: qty 2

## 2023-05-16 MED ORDER — SOD CITRATE-CITRIC ACID 500-334 MG/5ML PO SOLN: 30.0000 mL | ORAL | Status: DC | PRN

## 2023-05-16 MED ORDER — TERBUTALINE SULFATE 1 MG/ML IJ SOLN
0.2500 mg | Freq: Once | INTRAMUSCULAR | Status: DC | PRN
Start: 2023-05-16 — End: 2023-05-17

## 2023-05-16 MED ORDER — OXYTOCIN-SODIUM CHLORIDE 30-0.9 UT/500ML-% IV SOLN
1.0000 m[IU]/min | INTRAVENOUS | Status: DC
  Administered 2023-05-17: 2 m[IU]/min via INTRAVENOUS
  Filled 2023-05-16: qty 500

## 2023-05-16 MED ORDER — LIDOCAINE HCL (PF) 1 % IJ SOLN
30.0000 mL | INTRAMUSCULAR | Status: DC | PRN
Start: 2023-05-16 — End: 2023-05-17

## 2023-05-16 MED ORDER — PHENYLEPHRINE 80 MCG/ML (10ML) SYRINGE FOR IV PUSH (FOR BLOOD PRESSURE SUPPORT): 80.0000 ug | PREFILLED_SYRINGE | INTRAVENOUS | Status: DC | PRN

## 2023-05-16 MED ORDER — OXYTOCIN-SODIUM CHLORIDE 30-0.9 UT/500ML-% IV SOLN
2.5000 [IU]/h | INTRAVENOUS | Status: DC
  Administered 2023-05-17: 2.5 [IU]/h via INTRAVENOUS

## 2023-05-16 MED ORDER — LACTATED RINGERS IV SOLN: 500.0000 mL | INTRAVENOUS | Status: DC | PRN

## 2023-05-16 MED ORDER — EPHEDRINE 5 MG/ML INJ
10.0000 mg | INTRAVENOUS | Status: DC | PRN
Start: 2023-05-16 — End: 2023-05-17

## 2023-05-16 MED ORDER — ONDANSETRON HCL 4 MG/2ML IJ SOLN: 4.0000 mg | Freq: Four times a day (QID) | INTRAMUSCULAR | Status: DC | PRN

## 2023-05-16 MED ORDER — OXYCODONE-ACETAMINOPHEN 5-325 MG PO TABS: 1.0000 | ORAL_TABLET | ORAL | Status: DC | PRN

## 2023-05-16 MED ORDER — LACTATED RINGERS IV SOLN: INTRAVENOUS | Status: DC

## 2023-05-16 MED ORDER — OXYCODONE-ACETAMINOPHEN 5-325 MG PO TABS: 2.0000 | ORAL_TABLET | ORAL | Status: DC | PRN

## 2023-05-16 MED ORDER — DIPHENHYDRAMINE HCL 50 MG/ML IJ SOLN
12.5000 mg | INTRAMUSCULAR | Status: DC | PRN
  Administered 2023-05-17: 12.5 mg via INTRAVENOUS
  Filled 2023-05-16: qty 1

## 2023-05-16 MED ORDER — LACTATED RINGERS IV SOLN
500.0000 mL | Freq: Once | INTRAVENOUS | Status: AC
Start: 2023-05-17 — End: 2023-05-16
  Administered 2023-05-16: 500 mL via INTRAVENOUS

## 2023-05-16 MED ORDER — FENTANYL CITRATE (PF) 100 MCG/2ML IJ SOLN
50.0000 ug | INTRAMUSCULAR | Status: DC | PRN
Start: 2023-05-16 — End: 2023-05-17
  Administered 2023-05-16: 50 ug via INTRAVENOUS
  Filled 2023-05-16: qty 2

## 2023-05-16 MED ORDER — LIDOCAINE HCL (PF) 1 % IJ SOLN
INTRAMUSCULAR | Status: DC | PRN
  Administered 2023-05-16: 4 mL via EPIDURAL
  Administered 2023-05-16: 5 mL via EPIDURAL

## 2023-05-16 MED ORDER — EPHEDRINE 5 MG/ML INJ: 10.0000 mg | INTRAVENOUS | Status: DC | PRN

## 2023-05-16 NOTE — Anesthesia Preprocedure Evaluation (Signed)
Anesthesia Evaluation  Patient identified by MRN, date of birth, ID band Patient awake    Reviewed: Allergy & Precautions, Patient's Chart, lab work & pertinent test results  Airway Mallampati: II       Dental no notable dental hx.    Pulmonary neg pulmonary ROS   Pulmonary exam normal        Cardiovascular negative cardio ROS Normal cardiovascular exam Rhythm:Regular     Neuro/Psych negative neurological ROS  negative psych ROS   GI/Hepatic Neg liver ROS,GERD  ,,  Endo/Other  Obesity  Renal/GU negative Renal ROS  negative genitourinary   Musculoskeletal   Abdominal  (+) + obese  Peds  Hematology  (+) Blood dyscrasia, anemia   Anesthesia Other Findings   Reproductive/Obstetrics (+) Pregnancy Oligohydramnios                              Anesthesia Physical Anesthesia Plan  ASA: 2  Anesthesia Plan: Epidural   Post-op Pain Management:    Induction:   PONV Risk Score and Plan:   Airway Management Planned: Natural Airway  Additional Equipment: Fetal Monitoring and None  Intra-op Plan:   Post-operative Plan:   Informed Consent: I have reviewed the patients History and Physical, chart, labs and discussed the procedure including the risks, benefits and alternatives for the proposed anesthesia with the patient or authorized representative who has indicated his/her understanding and acceptance.       Plan Discussed with: Anesthesiologist  Anesthesia Plan Comments:          Anesthesia Quick Evaluation

## 2023-05-16 NOTE — Progress Notes (Signed)
Update from RN Patient is comfortable and not feeling intense contractions. She is planning on a nap.  Reviewed plan to check dilation progress at 6+hrs after AROM if not active beforehand. If no change by that time and not regular contractions will consider pitocin augmentation

## 2023-05-16 NOTE — Lactation Note (Signed)
Lactation Consultation Note  Patient Name: Becky Strickland [id number] Date: 05/16/2023 Age:32 y.o. Reason for consult: Other (Comment);Primapara;Term (mom in L&D requesting hand pump for nipple stimulation) Mom requested from L&D for stimulation to increase dilation and labor process.  Maternal Data    Feeding    LATCH Score                    Lactation Tools Discussed/Used Tools: Pump;Flanges Flange Size: 18;21 Breast pump type: Manual Pump Education:  (LC offered to demonstrated mom stated she knows how to use it.) Reason for Pumping: requested for nipple stimulation  Interventions    Discharge    Consult Status      Charyl Dancer 05/16/2023, 10:05 PM

## 2023-05-16 NOTE — Anesthesia Procedure Notes (Signed)
Epidural Patient location during procedure: OB Start time: 05/16/2023 11:41 PM End time: 05/16/2023 11:50 PM  Staffing Anesthesiologist: Mal Amabile, MD  Preanesthetic Checklist Completed: patient identified, IV checked, site marked, risks and benefits discussed, surgical consent, monitors and equipment checked, pre-op evaluation and timeout performed  Epidural Patient position: sitting Prep: DuraPrep and site prepped and draped Patient monitoring: continuous pulse ox and blood pressure Approach: midline Location: L4-L5 Injection technique: LOR air  Needle:  Needle type: Tuohy  Needle gauge: 17 G Needle length: 9 cm and 9 Needle insertion depth: 5 cm cm Catheter type: closed end flexible Catheter size: 19 Gauge Catheter at skin depth: 10 cm Test dose: negative and Other  Assessment Events: blood not aspirated, no cerebrospinal fluid, injection not painful, no injection resistance, no paresthesia and negative IV test  Additional Notes Patient identified. Risks and benefits discussed including failed block, incomplete  Pain control, post dural puncture headache, nerve damage, paralysis, blood pressure Changes, nausea, vomiting, reactions to medications-both toxic and allergic and post Partum back pain. All questions were answered. Patient expressed understanding and wished to proceed. Sterile technique was used throughout procedure. Epidural site was Dressed with sterile barrier dressing. No paresthesias, signs of intravascular injection Or signs of intrathecal spread were encountered.  Patient was more comfortable after the epidural was dosed. Please see RN's note for documentation of vital signs and FHR which are stable. Reason for block:procedure for pain

## 2023-05-16 NOTE — MAU Note (Signed)
Becky Strickland is a 32 y.o. at [redacted]w[redacted]d here in MAU reporting: she's having ctxs that are 5-10 minutes apart and began last night at approximately 2000.  Denies VB or LOF.  Endorses +FM. LMP: NA Onset of complaint: last night Pain score: 8 Vitals:   05/16/23 0850  BP: 113/76  Pulse: 84  Resp: 19  Temp: 98 F (36.7 C)  SpO2: 100%     FHT:135 bpm Lab orders placed from triage:   None

## 2023-05-16 NOTE — H&P (Signed)
OBSTETRIC ADMISSION HISTORY AND PHYSICAL  Becky Strickland is a 32 y.o. female G3P0020 with IUP at [redacted]w[redacted]d by LMP presenting for PDIOL & ?SROM. She reports +FMs, No LOF, no VB, no blurry vision, headaches or peripheral edema, and RUQ pain.  She plans on breast feeding. She declines birth control. She received her prenatal care at  [facility]    Dating: By LMP --->  Estimated Date of Delivery: 05/14/23  Sono:   @[redacted]w[redacted]d , CWD, normal anatomy, cephalic presentation, anterior placental lie, 3123 gm 6 lb 14 oz 81%; AC >99%   Prenatal History/Complications:  - Pyelectasis of fetus on prenatal [place]; resolved - Uterine fibroids - Silent carrier alpha thalassemia - LR NIPT, Neg AFP  Past Medical History: Past Medical History:  Diagnosis Date   Medical history non-contributory     Past Surgical History: Past Surgical History:  Procedure Laterality Date   NO PAST SURGERIES      Obstetrical History: OB History     Gravida  3   Para      Term      Preterm      AB  2   Living         SAB  2   IAB  0   Ectopic      Multiple      Live Births              Social History Social History   Socioeconomic History   Marital status: Single    Spouse name: Not on file   Number of children: Not on file   Years of education: Not on file   Highest education level: Not on file  Occupational History   Not on file  Tobacco Use   Smoking status: Never   Smokeless tobacco: Never  Vaping Use   Vaping status: Never Used  Substance and Sexual Activity   Alcohol use: Not Currently    Comment: occasionally   Drug use: No   Sexual activity: Yes    Birth control/protection: None    Comment: last intercurse 27 Jul 2014  Other Topics Concern   Not on file  Social History Narrative   Not on file   Social Determinants of Health   Financial Resource Strain: Not on file  Food Insecurity: No Food Insecurity (04/19/2023)   Hunger Vital Sign    Worried About Running Out of Food in the  Last Year: Never true    Ran Out of Food in the Last Year: Never true  Transportation Needs: No Transportation Needs (11/25/2022)   PRAPARE - Administrator, Civil Service (Medical): No    Lack of Transportation (Non-Medical): No  Physical Activity: Not on file  Stress: Not on file  Social Connections: Unknown (11/14/2021)   [facility]   Social Network    Social Network: Not on file    Family History: Family History  Problem Relation Age of Onset   Fibroids Mother    Healthy Father    Fibroids Maternal Aunt    Fibroids Maternal Grandmother     Allergies: No Known Allergies  Medications Prior to Admission  Medication Sig Dispense Refill Last Dose   Ferric Maltol 30 MG CAPS Take 1 capsule (30 mg total) by mouth in the morning and at bedtime. 60 capsule 9 05/16/2023   Prenatal Vit-Fe Fumarate-FA (PRENATAL VITAMINS) 28-0.8 MG TABS Take 1 tablet by mouth daily. 30 tablet 11 05/16/2023   aspirin EC 81  MG tablet Take 1 tablet (81 mg total) by mouth daily. Swallow whole. (Patient not taking: Reported on 01/12/2023) 30 tablet 12      Review of Systems   All systems reviewed and negative except as stated in HPI  Blood pressure 113/76, pulse 84, temperature 98 F (36.7 C), temperature source Oral, resp. rate 19, height 5\' 2"  (1.575 m), weight 74.7 kg, last menstrual period 08/07/2022, SpO2 100%. General appearance: alert, cooperative, appears stated age, and moderate distress Lungs: clear to auscultation bilaterally Heart: regular rate and rhythm Abdomen: soft, non-tender; bowel sounds normal Extremities: Homans sign is negative, no sign of DVT Presentation: cephalic Fetal monitoringBaseline: 140 bpm, Variability: Good {> 6 bpm), Accelerations: Reactive, and Decelerations: Absent Uterine activityFrequency: Every 2-6 minutes Dilation: 2 Effacement (%): 60 Station: -3 Exam by:: Carroll Kinds RN   Prenatal labs: ABO, Rh: O/Positive/-- (04/22  1623) Antibody: Negative (04/22 1623) Rubella: 1.12 (04/22 1623) RPR: Non Reactive (08/09 0830)  HBsAg: Negative (04/22 1623)  HIV: Non Reactive (08/09 0830)  GBS: Negative/-- (10/07 0936)  2 hr Glucola passed Genetic screening  LR NIPS, AFP neg Anatomy US pyelectasis resolved  Prenatal Transfer Tool  Maternal Diabetes: No Genetic Screening: Normal Maternal Ultrasounds/Referrals: Normal Fetal Ultrasounds or other Referrals:  Other: pyelectasis resolved Maternal Substance Abuse:  No Significant Maternal Medications:  None Significant Maternal Lab Results:  Group B Strep negative Number of Prenatal Visits:greater than 3 verified prenatal visits Other Comments:  None  No results found for this or any previous visit (from the past 24 hour(s)).  Patient Active Problem List   Diagnosis Date Noted   Fibroid uterus 12/28/2022   Alpha thalassemia silent carrier 12/01/2022   Supervision of low-risk pregnancy 10/28/2022    Assessment/Plan:  Becky Strickland is a 32 y.o. G3P0020 at [redacted]w[redacted]d here for PDIOL  #Labor: Expectant management for now. Augmentation with AROM as able. Desires water bith, Dr. Alvester Morin on call (aware). #Pain: Discussed #FWB: Cat I  #ID:  GBS negative #MOF: Breast #MOC: Declines #Circ:  Yes  Wyn Forster, MD  05/16/2023, 11:06 AM

## 2023-05-16 NOTE — Progress Notes (Signed)
Called by RN Marcelle Overlie as I am the [facility] on call provider.   There was concern for low AFI that lead to admission. Patient is desiring lowest intervention delivery as possible and has a reassuring NST.   Discussed getting formal BPP as this would offer reassurance and help provide additional AFI information to help with shared decision making with the patient.   Patient is not currently in labor and would necessitate IOL if oligohydramnios is present. She is planning a Waterbirth if possible.  Dr. Lyndel Safe

## 2023-05-16 NOTE — Progress Notes (Signed)
Called by RN Marcelle Overlie about [place] results which are consistent with concern for oligohydramnios.   Reviewed IOL process and that if not on pitocin and laboring then Linden Dolin is still an option.  Reviewed options for 1) cytotec 2) FB 3) Cytotec +FB  With option 3 being more likely to jumpstart her labor and advance her to the point where AROM is an option. If laboring with these and dilation advancing then WB is a viable option.  On site team will manage until patient is at a point where we are considering immersion.  Dr Alvester Morin

## 2023-05-16 NOTE — Progress Notes (Signed)
Labor Progress Note Becky Strickland is a 32 y.o. G3P0020 at [redacted]w[redacted]d presented for PDIOL and was found to have oligohydramnios S: Coping well with latent labor. Discussed risk, benefits, and indications for cytotec, foley balloon, AROM and pitocin. Pt agreeable.  O:  BP 118/75   Pulse 86   Temp 98 F (36.7 C) (Oral)   Resp 19   Ht 5\' 2"  (1.575 m)   Wt 74.7 kg   LMP 08/07/2022 (Exact Date)   SpO2 100%   BMI 30.11 kg/m  EFM: 140/moderate variability/accels present/no decels  CVE: Dilation: 4 Effacement (%): 90 Cervical Position: Middle Station: -2 Presentation: Vertex Exam by:: Dr. Leanora Strickland   A&P: 32 y.o. [id number] [redacted]w[redacted]d  #Labor: Progressing well. AROM clear.  #Pain: Coping well #FWB: Cat I #GBS negative   Wyn Forster, MD 1:48 PM

## 2023-05-16 NOTE — Progress Notes (Signed)
Labor Progress Note  Becky Strickland is a 32 y.o. G3P0020 at [redacted]w[redacted]d presented for PDIOL, found to have oligohydramnios   S: Pt at the bedside, intermittently uncomfortable, no concerns at this time.   O:  BP 121/69   Pulse 94   Temp 98.1 F (36.7 C) (Oral)   Resp 18   Ht 5\' 2"  (1.575 m)   Wt 74.7 kg   LMP 08/07/2022 (Exact Date)   SpO2 100%   BMI 30.11 kg/m  EFM:140bpm/Moderate variability/ 15x15 accels/ None decels CAT: 1 Toco: every    CVE: Dilation: 5 Effacement (%): 90 Cervical Position: Middle Station: -1 Presentation: Vertex Exam by:: Marcelle Overlie RN   A&P: 32 y.o. [id number] [redacted]w[redacted]d  here for PDIOL as above  #Labor: Met with patient and discussed with her, FOB, MGM, and PGM the concerns about rupture ~7.5hours at time of discussion (AROM 1330), lack of significant cervical change since AROM.  Patient wishes to go as natural as she can and would like to avoid augmentation as she desires water birth.  Pt not due for check for 2 hours so will plan to do nipple stimulation, hot shower, and continued movement. However, if no change at the next check will discuss again about need for augmentation.  Even if she is augmented with pitocin, she might be a candidate that we can do pitocin get to active labor and assess for cervical change, turn off pitocin for an hour and if recheck still making change can get in the tub.  I've spoke with CNM in [facility] and she is agreeable to that plan.  #Pain: Nitrous oxide #FWB: CAT 1 #GBS negative  #Fibroid Uterus: careful with fundal checks PP 2024 Posterior                5.8        2.93       4.51        Intramural  Left/ant                 2          1.5        1.76        Intramural  Left/ant                 2.24       1.27       2.23        Intramural  Anterior                 1.87       1.18       1.67        Intramural  Anterior                 1.47       0.94       1.43        Intramural  Hessie Dibble, MD FMOB Fellow, Faculty  practice [facility], Center for [facility] 05/16/23  9:09 PM

## 2023-05-17 ENCOUNTER — Encounter (HOSPITAL_COMMUNITY): Payer: Self-pay | Admitting: Obstetrics and Gynecology

## 2023-05-17 DIAGNOSIS — O4103X0 Oligohydramnios, third trimester, not applicable or unspecified: Secondary | ICD-10-CM | POA: Diagnosis not present

## 2023-05-17 DIAGNOSIS — Z3A40 40 weeks gestation of pregnancy: Secondary | ICD-10-CM | POA: Diagnosis not present

## 2023-05-17 DIAGNOSIS — O480 Post-term pregnancy: Secondary | ICD-10-CM | POA: Diagnosis not present

## 2023-05-17 DIAGNOSIS — O411230 Chorioamnionitis, third trimester, not applicable or unspecified: Secondary | ICD-10-CM | POA: Diagnosis not present

## 2023-05-17 LAB — RPR: RPR Ser Ql: NONREACTIVE

## 2023-05-17 MED ORDER — ACETAMINOPHEN 325 MG PO TABS: 650.0000 mg | ORAL_TABLET | ORAL | Status: DC | PRN

## 2023-05-17 MED ORDER — TETANUS-DIPHTH-ACELL PERTUSSIS 5-2.5-18.5 LF-MCG/0.5 IM SUSY: 0.5000 mL | PREFILLED_SYRINGE | Freq: Once | INTRAMUSCULAR | Status: DC

## 2023-05-17 MED ORDER — LACTATED RINGERS AMNIOINFUSION
INTRAVENOUS | Status: DC
  Administered 2023-05-17: 125 mL via INTRAUTERINE

## 2023-05-17 MED ORDER — DIBUCAINE (PERIANAL) 1 % EX OINT: 1.0000 | TOPICAL_OINTMENT | CUTANEOUS | Status: DC | PRN

## 2023-05-17 MED ORDER — WITCH HAZEL-GLYCERIN EX PADS: 1.0000 | MEDICATED_PAD | CUTANEOUS | Status: DC | PRN

## 2023-05-17 MED ORDER — SODIUM CHLORIDE 0.9% FLUSH
3.0000 mL | INTRAVENOUS | Status: DC | PRN
Start: 2023-05-17 — End: 2023-05-19

## 2023-05-17 MED ORDER — SIMETHICONE 80 MG PO CHEW: 80.0000 mg | CHEWABLE_TABLET | ORAL | Status: DC | PRN

## 2023-05-17 MED ORDER — COCONUT OIL OIL: 1.0000 | TOPICAL_OIL | Status: DC | PRN

## 2023-05-17 MED ORDER — IBUPROFEN 800 MG PO TABS
800.0000 mg | ORAL_TABLET | Freq: Three times a day (TID) | ORAL | Status: DC
  Administered 2023-05-18 – 2023-05-19 (×5): 800 mg via ORAL
  Filled 2023-05-17 (×7): qty 1

## 2023-05-17 MED ORDER — GENTAMICIN SULFATE 40 MG/ML IJ SOLN
5.0000 mg/kg | INTRAVENOUS | Status: AC
  Administered 2023-05-18: 300 mg via INTRAVENOUS
  Filled 2023-05-17: qty 7.5

## 2023-05-17 MED ORDER — SODIUM CHLORIDE 0.9% FLUSH: 3.0000 mL | Freq: Two times a day (BID) | INTRAVENOUS | Status: DC

## 2023-05-17 MED ORDER — MEASLES, MUMPS & RUBELLA VAC IJ SOLR: 0.5000 mL | Freq: Once | INTRAMUSCULAR | Status: DC

## 2023-05-17 MED ORDER — ONDANSETRON HCL 4 MG/2ML IJ SOLN: 4.0000 mg | INTRAMUSCULAR | Status: DC | PRN

## 2023-05-17 MED ORDER — BENZOCAINE-MENTHOL 20-0.5 % EX AERO
1.0000 | INHALATION_SPRAY | CUTANEOUS | Status: DC | PRN
  Administered 2023-05-18: 1 via TOPICAL
  Filled 2023-05-17: qty 56

## 2023-05-17 MED ORDER — SODIUM CHLORIDE 0.9 % IV SOLN
2.0000 g | Freq: Four times a day (QID) | INTRAVENOUS | Status: DC
  Administered 2023-05-17 (×2): 2 g via INTRAVENOUS
  Filled 2023-05-17 (×2): qty 2000

## 2023-05-17 MED ORDER — GENTAMICIN SULFATE 40 MG/ML IJ SOLN
5.0000 mg/kg | INTRAVENOUS | Status: DC
  Administered 2023-05-17: 300 mg via INTRAVENOUS
  Filled 2023-05-17 (×2): qty 7.5

## 2023-05-17 MED ORDER — SODIUM CHLORIDE 0.9% FLUSH: 10.0000 mL | Freq: Two times a day (BID) | INTRAVENOUS | Status: DC

## 2023-05-17 MED ORDER — PRENATAL MULTIVITAMIN CH
1.0000 | ORAL_TABLET | Freq: Every day | ORAL | Status: DC
  Administered 2023-05-18 – 2023-05-19 (×2): 1 via ORAL
  Filled 2023-05-17 (×2): qty 1

## 2023-05-17 MED ORDER — DIPHENHYDRAMINE HCL 25 MG PO CAPS: 25.0000 mg | ORAL_CAPSULE | Freq: Four times a day (QID) | ORAL | Status: DC | PRN

## 2023-05-17 MED ORDER — SENNOSIDES-DOCUSATE SODIUM 8.6-50 MG PO TABS
2.0000 | ORAL_TABLET | ORAL | Status: DC
  Filled 2023-05-17: qty 2

## 2023-05-17 MED ORDER — POLYETHYLENE GLYCOL 3350 17 G PO PACK
17.0000 g | PACK | Freq: Every day | ORAL | Status: DC
  Administered 2023-05-18: 17 g via ORAL
  Filled 2023-05-17 (×2): qty 1

## 2023-05-17 MED ORDER — AMPICILLIN SODIUM 2 G IJ SOLR
2.0000 g | Freq: Four times a day (QID) | INTRAMUSCULAR | Status: DC
  Administered 2023-05-17 – 2023-05-18 (×3): 2 g via INTRAVENOUS
  Filled 2023-05-17 (×3): qty 2000

## 2023-05-17 MED ORDER — ONDANSETRON HCL 4 MG PO TABS: 4.0000 mg | ORAL_TABLET | ORAL | Status: DC | PRN

## 2023-05-17 NOTE — Progress Notes (Signed)
LABOR PROGRESS NOTE  G3P0020 at [redacted]w[redacted]d admitted for IOL 2/2 oligohydramnios.  S: Patient pushing w family at bedside, reports feeling tired, but no complaints.  O:  BP 116/76   Pulse 100   Temp 98 F (36.7 C) (Axillary)   Resp 18   Ht 5\' 2"  (1.575 m)   Wt 74.7 kg   LMP 08/07/2022 (Exact Date)   SpO2 99%   BMI 30.11 kg/m  EFM: 125/min/+a/+variables   CVE: Dilation: 10 Dilation Complete Date: 05/17/23 Dilation Complete Time: 1354 Effacement (%): 90 Cervical Position: Middle Station: Plus 1 Presentation: Vertex Exam by:: Arther Abbott RN   A&P:   #Labor: Pt pushing well, at +1 to +2 station w pushing  on small dose of Pitocin to get ctx closer together #Pain: Epidural  #FWB: CAT 2 #GBS negative  #Chorioamnionitis: cont Amp/Gent  #Fibroid Uterus: 2024 Posterior                5.8        2.93       4.51        Intramural  Left/ant                 2          1.5        1.76        Intramural  Left/ant                 2.24       1.27       2.23        Intramural  Anterior                 1.87       1.18       1.67        Intramural  Anterior                 1.47       0.94       1.43        Intramural  Sundra Aland, MD OB Fellow, Faculty Practice [facility], Center for [facility]

## 2023-05-17 NOTE — Progress Notes (Addendum)
Patient ID: Becky Strickland, female   DOB: 04/05/1991, 32 y.o.   MRN: [id number] LABOR PROGRESS NOTE  Patient Name: Becky Strickland, female   DOB: 1991-03-25, 32 y.o.  MRN: [id number]  [id number] at [redacted]w[redacted]d admitted for PDIOL, found to have oligohydramnios  S: Patient feeling more comfortable following epidural, no concerns at this time.   O:  BP 100/72   Pulse (!) 102   Temp 98.2 F (36.8 C) (Oral)   Resp 16   Ht 5\' 2"  (1.575 m)   Wt 74.7 kg   LMP 08/07/2022 (Exact Date)   SpO2 98%   BMI 30.11 kg/m  EFM:150bpm/Moderate variability/ 15x15 accels/ Variable decels CAT: 1 Toco: regular, every 3-5 minutes minutes with some gaps noted on strip   CVE: Dilation: 6 Effacement (%): 90 Cervical Position: Middle Station: -1 Presentation: Vertex Exam by:: Orvan Seen, RN   A&P:   #Labor: Progressing. Spoke with nursing and discussed ongoing titration of pitocin, nursing in agreement with plan.  #Pain: Epidural placed  #FWB: CAT 1 #GBS negative #Anticipate vaginal delivery  #Fibroid Uterus: careful with fundal checks PP 2024 Posterior                5.8        2.93       4.51        Intramural  Left/ant                 2          1.5        1.76        Intramural  Left/ant                 2.24       1.27       2.23        Intramural  Anterior                 1.87       1.18       1.67        Intramural  Anterior                 1.47       0.94       1.43        Intramural  Bryna Colander, DO PGY 2  05/17/23  2:44 AM  Evaluation and management procedures were performed by the [facility] Medicine Resident under my supervision. I was immediately available for direct supervision, assistance and direction throughout this encounter.  I also confirm that I have verified the information documented in the resident's note, and that I have also personally reperformed the pertinent components of the physical exam and all of the medical decision making activities.  I have also made any  necessary editorial changes.   Mittie Bodo, MD Family Medicine - Obstetrics Fellow

## 2023-05-17 NOTE — Progress Notes (Addendum)
LABOR PROGRESS NOTE  G3P0020 at [redacted]w[redacted]d admitted for IOL 2/2 oligohydramnios.  S: Patient denies any complaints.   O:  BP 107/70   Pulse (!) 112   Temp 99.2 F (37.3 C) (Oral)   Resp 18   Ht 5\' 2"  (1.575 m)   Wt 74.7 kg   LMP 08/07/2022 (Exact Date)   SpO2 98%   BMI 30.11 kg/m  EFM: 145/mod/+a/+variables    CVE: Dilation: 7 Effacement (%): 90 Cervical Position: Middle Station: -1 Presentation: Vertex Exam by:: Arther Abbott RN   A&P:   #Labor: Discussed placement of IUPC to help better assess adequacy and start amnioinfusion given recurrent variables #Pain: Epidural  #FWB: CAT 2 #GBS negative  #Chorioamnionitis: cont Amp/Gent  #Fibroid Uterus: 2024 Posterior                5.8        2.93       4.51        Intramural  Left/ant                 2          1.5        1.76        Intramural  Left/ant                 2.24       1.27       2.23        Intramural  Anterior                 1.87       1.18       1.67        Intramural  Anterior                 1.47       0.94       1.43        Intramural  Sundra Aland, MD OB Fellow, Faculty Practice [facility], Center for [facility]

## 2023-05-17 NOTE — Progress Notes (Signed)
ANTIBIOTIC CONSULT NOTE - INITIAL  Pharmacy Consult for Gentamicin Indication: Chorioamnionitis   No Known Allergies  Patient Measurements: Height: 5\' 2"  (157.5 cm) Weight: 74.7 kg (164 lb 9.6 oz) IBW/kg (Calculated) : 50.1 Adjusted Body Weight: 59.9 kg  Vital Signs: Temp: 98.4 F (36.9 C) (11/04 0547) Temp Source: Oral (11/04 0547) BP: 107/60 (11/04 0531) Pulse Rate: 120 (11/04 0501)  Labs: Recent Labs    05/16/23 1216  WBC 8.2  HGB 12.6  PLT 277   No results for input(s): "GENTTROUGH", "GENTPEAK", "GENTRANDOM" in the last 72 hours.   Microbiology: Recent Results (from the past 720 hour(s))  Culture, beta strep (group b only)     Status: None   Collection Time: 04/19/23  9:36 AM   Specimen: Vaginal/Rectal; Genital   VR  Result Value Ref Range Status   Strep Gp B Culture Negative Negative Final    Comment: Centers for Disease Control and Prevention (CDC) and American Congress of Obstetricians and Gynecologists (ACOG) guidelines for prevention of perinatal group B streptococcal (GBS) disease specify co-collection of a vaginal and rectal swab specimen to maximize sensitivity of GBS detection. Per the CDC and ACOG, swabbing both the lower vagina and rectum substantially increases the yield of detection compared with sampling the vagina alone. Penicillin G, ampicillin, or cefazolin are indicated for intrapartum prophylaxis of perinatal GBS colonization. Reflex susceptibility testing should be performed prior to use of clindamycin only on GBS isolates from penicillin-allergic women who are considered a high risk for anaphylaxis. Treatment with vancomycin without additional testing is warranted if resistance to clindamycin is noted.     Medications:  Ampicillin 2g Q6  Assessment: 32 y.o. female G3P0020 at [redacted]w[redacted]d with presumed chorioamnionitis starting ampicillin and gentamicin.   Plan:  Gentamicin 5 mg/kg adjusted weight Q24 hours  Check Scr with next labs if  gentamicin continued. Will check gentamicin levels if continued > 72hr or clinically indicated.  Loyola Mast 05/17/2023,6:08 AM

## 2023-05-17 NOTE — Lactation Note (Signed)
This note was copied from a baby's chart. Lactation Consultation Note  Patient Name: Becky Strickland [id number] Date: 05/17/2023 Age:32 years Reason for consult: Initial assessment;1st time breastfeeding;Term.  LC used breast model to teach hand expression, MOB self expressed 1 ml of colostrum that was spoon fed to infant, MOB has areola edema in her left breast and sensitive breast. LC gave MOB support pillows to help with latching infant at the breast, MOB latched infant on her left breast using the football hold position, infant sustained latch and BF for 10 minutes. MOB was given hand pump earlier by RN with two flanges 21 mm and 18 mm to pre-pump breast prior to latching infant. MOB does not want to use it, she knows she can do reverse pressure softening prior to latching infant at the breast if she chooses. MOB will continue to BF infant by cues, on demand, every 2-3 hours, skin to skin. MOB knows to call for latch assistance if needed. LC discussed infant's input and output, the importance of maternal rest, diet and hydration. MOB was made aware of O/P services, breastfeeding support groups, community resources, and our phone # for post-discharge questions.    Today's  current feeding plan: 1- MOB can do reverse pressure softening or use hand pump prior to latching infant at the breast, continue to BF infant by cues, on demand, every 2-3 hours, skin to skin. 2- MOB knows to call for latch assistance if needed. 3- LC reinforce importance of MOB rest, hydration and balance diet.   Maternal Data Has patient been taught Hand Expression?: Yes  Feeding Mother's Current Feeding Choice: Breast Milk  LATCH Score Latch: Grasps breast easily, tongue down, lips flanged, rhythmical sucking.  Audible Swallowing: A few with stimulation  Type of Nipple: Flat (MOB has flat nipples LC  suggest reverse pressure softening, given hand pump by RN previously has 18 mm and 21 mm but does not want to use it  prior to latching infant at this time.)  Comfort (Breast/Nipple): Soft / non-tender  Hold (Positioning): Assistance needed to correctly position infant at breast and maintain latch.  LATCH Score: 7   Lactation Tools Discussed/Used    Interventions Interventions: Breast feeding basics reviewed;Assisted with latch;Skin to skin;Breast compression;Reverse pressure;Pre-pump if needed;Hand express;Position options;Support pillows;Adjust position;Education;LC Services brochure  Discharge    Consult Status Consult Status: Follow-up Date: 05/18/23 Follow-up type: In-patient    Frederico Hamman 05/17/2023, 7:30 PM

## 2023-05-17 NOTE — Discharge Summary (Signed)
Postpartum Discharge Summary  Date of Service updated***     Patient Name: Becky Strickland DOB: 1990-08-20 MRN: [id number]  Date of admission: 05/16/2023 Delivery date:05/17/2023 Delivering provider: Sundra Aland Date of discharge: 05/17/2023  Admitting diagnosis: Leakage of amniotic fluid [O42.90] Intrauterine pregnancy: [redacted]w[redacted]d     Secondary diagnosis:  Principal Problem:   SVD (spontaneous vaginal delivery) Active Problems:   Leakage of amniotic fluid  Additional problems: ***    Discharge diagnosis: Term Pregnancy Delivered                                              Post partum procedures:{Postpartum procedures:23558} Augmentation: AROM and Pitocin Complications: {OB Labor/Delivery Complications:20784}  Hospital course: Induction of Labor With Vaginal Delivery   32 y.o. yo G3P0020 at [redacted]w[redacted]d was admitted to the hospital 05/16/2023 for induction of labor.  Indication for induction: Postdates and oligohydramnios .  Patient had an labor course complicated by chorioamnionitis for which she was on Ampicillin and Gentamicin during labor. Membrane Rupture Time/Date: 1:39 PM,05/16/2023  Delivery Method:Vaginal, Spontaneous Operative Delivery:N/A Episiotomy: None Lacerations:  2nd degree Details of delivery can be found in separate delivery note.  Patient had a postpartum course complicated by***. Patient is discharged home 05/17/23.  Newborn Data: Birth date:05/17/2023 Birth time:3:23 PM Gender:Female Living status:Living Apgars:8 ,9  Weight:   Magnesium Sulfate received: {Mag received:30440022} BMZ received: No Rhophylac:N/A MMR:N/A T-DaP: Declined Flu: No RSV Vaccine received: No Transfusion:{Transfusion received:30440034}  Immunizations received:  There is no immunization history on file for this patient.  Physical exam  Vitals:   05/17/23 1531 05/17/23 1535 05/17/23 1550 05/17/23 1601  BP: 110/74  (!) 88/63 98/61  Pulse: (!) 110  (!) 113 (!) 146  Resp:       Temp:      TempSrc:      SpO2:  99%    Weight:      Height:       General: {Exam; general:21111117} Lochia: {Desc; appropriate/inappropriate:30686::"appropriate"} Uterine Fundus: {Desc; firm/soft:30687} Incision: {Exam; incision:21111123} DVT Evaluation: {Exam; dvt:2111122} Labs: Lab Results  Component Value Date   WBC 8.2 05/16/2023   HGB 12.6 05/16/2023   HCT 38.9 05/16/2023   MCV 86.1 05/16/2023   PLT 277 05/16/2023      Latest Ref Rng & Units 12/31/2021    1:37 PM  CMP  Glucose 70 - 99 mg/dL 97   BUN 6 - 20 mg/dL 5   Creatinine 4.09 - 8.11 mg/dL 9.14   Sodium 782 - 956 mmol/L 138   Potassium 3.5 - 5.1 mmol/L 3.8   Chloride 98 - 111 mmol/L 108   CO2 22 - 32 mmol/L 25   Calcium 8.9 - 10.3 mg/dL 8.9   Total Protein 6.5 - 8.1 g/dL 7.3   Total Bilirubin 0.3 - 1.2 mg/dL 0.3   Alkaline Phos 38 - 126 U/L 33   AST 15 - 41 U/L 14   ALT 0 - 44 U/L 11    Edinburgh Score:     No data to display         No data recorded  After visit meds:  Allergies as of 05/17/2023   No Known Allergies   Med Rec must be completed prior to using this [facility]***        Discharge home in stable condition Infant Feeding: {Baby feeding:23562} Infant Disposition:{CHL IP OB  HOME WITH ZOXWRU:04540} Discharge instruction: per After Visit Summary and Postpartum booklet. Activity: Advance as tolerated. Pelvic rest for 6 weeks.  Diet: {OB JWJX:91478295} Future Appointments: Future Appointments  Date Time Provider Department Center  05/21/2023 12:00 AM [facility] SCHED ROOM [facility] None   Follow up Visit: Message sent to [facility]  Please schedule this patient for a In person postpartum visit in 4 weeks with the following provider: Any provider. Additional Postpartum F/U:Postpartum Depression checkup  Low risk pregnancy complicated by:  n/a Delivery mode:  Vaginal, Spontaneous Anticipated Birth Control:  Unsure   05/17/2023 Sundra Aland, MD

## 2023-05-17 NOTE — Progress Notes (Signed)
LABOR PROGRESS NOTE  G3P0020 at [redacted]w[redacted]d admitted for IOL 2/2 oligohydramnios.  S: Patient resting at bedside, no concerns at this time.  O:  BP (!) 96/56   Pulse 93   Temp 98 F (36.7 C) (Axillary)   Resp 18   Ht 5\' 2"  (1.575 m)   Wt 74.7 kg   LMP 08/07/2022 (Exact Date)   SpO2 97%   BMI 30.11 kg/m  EFM: 135/mod/+a/+prolonged, improved after pitocin, +variables   CVE: Dilation: 9 Effacement (%): 90 Cervical Position: Middle Station: 0 Presentation: Vertex Exam by:: Arther Abbott RN   A&P:   #Labor: IUPC in place with AI running  stopped Pitocin 2/2 prolonged decel  anticipate SVD soon  plan discussed w pt and her partner at bedside  #Pain: Epidural  #FWB: CAT 2 #GBS negative  #Chorioamnionitis: cont Amp/Gent  #Fibroid Uterus: 2024 Posterior                5.8        2.93       4.51        Intramural  Left/ant                 2          1.5        1.76        Intramural  Left/ant                 2.24       1.27       2.23        Intramural  Anterior                 1.87       1.18       1.67        Intramural  Anterior                 1.47       0.94       1.43        Intramural  Sundra Aland, MD OB Fellow, Faculty Practice [facility], Center for [facility]

## 2023-05-18 ENCOUNTER — Encounter: Payer: Medicaid Other | Admitting: Obstetrics and Gynecology

## 2023-05-18 DIAGNOSIS — O411290 Chorioamnionitis, unspecified trimester, not applicable or unspecified: Secondary | ICD-10-CM | POA: Insufficient documentation

## 2023-05-18 MED ORDER — SODIUM CHLORIDE 0.9 % IV SOLN: INTRAVENOUS | Status: AC | PRN

## 2023-05-18 NOTE — Anesthesia Postprocedure Evaluation (Signed)
Anesthesia Post Note  Patient: Becky Strickland  Procedure(s) Performed: AN AD HOC LABOR EPIDURAL     Patient location during evaluation: Mother Baby Anesthesia Type: Epidural Level of consciousness: awake, oriented and awake and alert Pain management: pain level controlled Vital Signs Assessment: post-procedure vital signs reviewed and stable Respiratory status: respiratory function stable, spontaneous breathing and nonlabored ventilation Cardiovascular status: stable Postop Assessment: no headache, adequate PO intake, able to ambulate, no apparent nausea or vomiting and patient able to bend at knees Anesthetic complications: no   No notable events documented.  Last Vitals:  Vitals:   05/18/23 0134 05/18/23 0650  BP: 115/76 100/68  Pulse: (!) 113 84  Resp: 18 17  Temp: 37 C 36.8 C  SpO2: 99% 98%    Last Pain:  Vitals:   05/18/23 0902  TempSrc:   PainSc: 0-No pain   Pain Goal: Patients Stated Pain Goal: 8 (05/16/23 1537)              Epidural/Spinal Function Cutaneous sensation: Normal sensation (05/18/23 0902), Patient able to flex knees: Yes (05/18/23 0902), Patient able to lift hips off bed: Yes (05/18/23 0902), Back pain beyond tenderness at insertion site: No (05/18/23 0902), Progressively worsening motor and/or sensory loss: No (05/18/23 0902), Bowel and/or bladder incontinence post epidural: No (05/18/23 0902)  [name]

## 2023-05-18 NOTE — Lactation Note (Signed)
This note was copied from a baby's chart. Lactation Consultation Note  Patient Name: Boy Temica Righetti [id number] Date: 05/18/2023 Age:30 hours Reason for consult: Follow-up assessment;Term  P1 40 wks, LC rounds- Mom receptive. Mom fed twice this morning for 10 minutes, highlighted "cluster feeding"- discussed steps of latching. Discussed working to get big mouth latch from baby and starting feed with hand expression to get baby interested. Mom shares infant still sleepy- encouraged normal in 1st 24 hours of life. Highlighted expectations of feeding/diapering progression through first days/nights. Baby with good input/output. Mom has electric pump set up in room- encouraged use to stimulate milk production if formula supplementation used. Mom also has hand pump. Encouraged mom to call when feeding, for [facility] assist as desired.  Feeding Mother's Current Feeding Choice: Breast Milk   Interventions Interventions: Breast feeding basics reviewed;Education  Discharge Pump: DEBP;Manual  Consult Status Consult Status: Follow-up Date: 05/19/23    Idamae Lusher RN IBCLC 05/18/2023, 11:43 AM

## 2023-05-18 NOTE — Progress Notes (Signed)
POSTPARTUM PROGRESS NOTE  Post Partum Day 1 Subjective:  Becky Strickland is a 32 y.o. [id number] [redacted]w[redacted]d s/p nsvd.  No acute events overnight.  Pt denies problems with ambulating, voiding or po intake.  She denies nausea or vomiting.  Pain is well controlled.  She has had flatus. She has not had bowel movement.  Lochia Small.   Objective: Blood pressure 100/68, pulse 84, temperature 98.2 F (36.8 C), temperature source Oral, resp. rate 17, height 5\' 2"  (1.575 m), weight 74.7 kg, last menstrual period 08/07/2022, SpO2 98%, unknown if currently breastfeeding.  Physical Exam:  General: alert, cooperative and no distress Lochia:normal flow Chest: CTAB Heart: RRR no m/r/g Abdomen: +BS, soft, nontender,  Uterine Fundus: firm,  DVT Evaluation: No calf swelling or tenderness Extremities: trace edema  Recent Labs    05/16/23 1216  HGB 12.6  HCT 38.9    Assessment/Plan:  ASSESSMENT: Becky Strickland is a 32 y.o. A5W0981 [redacted]w[redacted]d s/p nsvd, doing well. No s/s pp endometritis, will stop abx. Breastfeeding, circ consented today, declines contraception. Plan for d/c home tomorrow as baby needs to stay.   LOS: 2 days   Becky Strickland 05/18/2023, 9:17 AM

## 2023-05-19 ENCOUNTER — Other Ambulatory Visit (HOSPITAL_COMMUNITY): Payer: Self-pay

## 2023-05-19 ENCOUNTER — Other Ambulatory Visit: Payer: Medicaid Other

## 2023-05-19 ENCOUNTER — Encounter: Payer: Medicaid Other | Admitting: Obstetrics and Gynecology

## 2023-05-19 LAB — SURGICAL PATHOLOGY

## 2023-05-19 MED ORDER — IBUPROFEN 800 MG PO TABS
800.0000 mg | ORAL_TABLET | Freq: Three times a day (TID) | ORAL | 0 refills | Status: DC
  Filled 2023-05-19: qty 30, 10d supply, fill #0

## 2023-05-19 MED ORDER — ACETAMINOPHEN 325 MG PO TABS
650.0000 mg | ORAL_TABLET | ORAL | 0 refills | Status: DC | PRN
  Filled 2023-05-19: qty 60, 5d supply, fill #0

## 2023-05-21 ENCOUNTER — Inpatient Hospital Stay (HOSPITAL_COMMUNITY): Admission: RE | Admit: 2023-05-21 | Payer: Medicaid Other | Source: Home / Self Care | Admitting: Family Medicine

## 2023-05-21 ENCOUNTER — Inpatient Hospital Stay (HOSPITAL_COMMUNITY): Payer: Medicaid Other

## 2023-06-01 ENCOUNTER — Telehealth (HOSPITAL_COMMUNITY): Payer: Self-pay

## 2023-06-01 NOTE — Telephone Encounter (Signed)
06/01/2023 1328  Name: Becky Strickland MRN: [id number] DOB: 1991/03/03  Reason for Call:  Transition of Care Hospital Discharge Call  Contact Status: Patient Contact Status: Complete  Language assistant needed:          Follow-Up Questions: Do You Have Any Concerns About Your Health As You Heal From Delivery?: Yes What Concerns Do You Have About Your Health?: Patient asks about her stitches in perineum. RN reviewed perineum care and normal healing process of perineum. Patient has no other questions or concerns about her healing. Do You Have Any Concerns About Your Infants Health?: No  Edinburgh Postnatal Depression Scale:  In the Past 7 Days: I have been able to laugh and see the funny side of things.: As much as I always could I have looked forward with enjoyment to things.: As much as I ever did I have blamed myself unnecessarily when things went wrong.: No, never I have been anxious or worried for no good reason.: No, not at all I have felt scared or panicky for no good reason.: No, not at all Things have been getting on top of me.: No, I have been coping as well as ever I have been so unhappy that I have had difficulty sleeping.: Not at all I have felt sad or miserable.: No, not at all I have been so unhappy that I have been crying.: No, never The thought of harming myself has occurred to me.: Never Edinburgh Postnatal Depression Scale Total: 0  PHQ2-9 Depression Scale:     Discharge Follow-up: Edinburgh score requires follow up?: No Patient was advised of the following resources:: Breastfeeding Support Group, Support Group  Post-discharge interventions: NA  Signature  Signe Colt

## 2023-06-02 ENCOUNTER — Encounter: Payer: Self-pay | Admitting: General Practice

## 2023-06-17 ENCOUNTER — Encounter: Payer: Self-pay | Admitting: Family Medicine

## 2023-06-17 ENCOUNTER — Ambulatory Visit: Payer: Medicaid Other | Admitting: Family Medicine

## 2023-06-17 ENCOUNTER — Other Ambulatory Visit: Payer: Self-pay

## 2023-06-17 NOTE — Progress Notes (Signed)
Post Partum Visit Note  Becky Strickland is a 32 y.o. G60P1021 female who presents for a postpartum visit. She is 4 weeks postpartum following a normal spontaneous vaginal delivery.  I have fully reviewed the prenatal and intrapartum course. The delivery was at [redacted]w[redacted]d gestational weeks.  Anesthesia: epidural. Postpartum course has been normal. Baby is doing well. Baby is feeding by both breast and bottle - Enfamil and EBM . Bleeding staining only. Bowel function is abnormal: c/o muscles in bottom being sore and weak . Bladder function is normal. Patient is not sexually active. Contraception method is none. Postpartum depression screening: negative.   The pregnancy intention screening data noted above was reviewed. Potential methods of contraception were discussed. The patient elected to proceed with No data recorded.   Edinburgh Postnatal Depression Scale - 06/17/23 0900       Edinburgh Postnatal Depression Scale:  In the Past 7 Days   I have been able to laugh and see the funny side of things. 0    I have looked forward with enjoyment to things. 0    I have blamed myself unnecessarily when things went wrong. 0    I have been anxious or worried for no good reason. 0    I have felt scared or panicky for no good reason. 0    Things have been getting on top of me. 0    I have been so unhappy that I have had difficulty sleeping. 0    I have felt sad or miserable. 0    I have been so unhappy that I have been crying. 0    The thought of harming myself has occurred to me. 0    Edinburgh Postnatal Depression Scale Total 0             Health Maintenance Due  Topic Date Due   DTaP/Tdap/Td (1 - Tdap) Never done   COVID-19 Vaccine (1 - 2023-24 season) Never done    The following portions of the patient's history were reviewed and updated as appropriate: allergies, current medications, past family history, past medical history, past social history, past surgical history, and problem  list.  Review of Systems Pertinent items noted in HPI and remainder of comprehensive ROS otherwise negative.  Objective:  BP 118/89   Pulse 94   Ht 5\' 2"  (1.575 m)   Wt 148 lb 12.8 oz (67.5 kg)   LMP 08/07/2022 (Exact Date)   Breastfeeding Yes   BMI 27.22 kg/m    General:  alert, cooperative, and appears stated age   Breasts:  not indicated  Lungs: Normal effort  Heart:  regular rate and rhythm  Abdomen: soft, non-tender; bowel sounds normal; no masses,  no organomegaly   GU exam:  not indicated       Assessment:   Normal postpartum exam.   Plan:   Essential components of care per ACOG recommendations:  1.  Mood and well being: Patient with negative depression screening today. Reviewed local resources for support.  - Patient tobacco use? No.   - hx of drug use? No.    2. Infant care and feeding:  -Patient currently breastmilk feeding? Yes. Reviewed importance of draining breast regularly to support lactation.  -Social determinants of health (SDOH) reviewed in EPIC. No concerns  3. Sexuality, contraception and birth spacing - Patient does not want a pregnancy in the next year.  Desired family size is 2 children.  - Reviewed reproductive life planning. Reviewed contraceptive methods  based on pt preferences and effectiveness.  Patient desired No Method - No Contraceptive Precautions today.   - Discussed birth spacing of 18 months  4. Sleep and fatigue -Encouraged family/partner/community support of 4 hrs of uninterrupted sleep to help with mood and fatigue  5. Physical Recovery  - Discussed patients delivery and complications. She describes her labor as bad. - Patient had a Vaginal, no problems at delivery. Patient had a 2nd degree laceration. Perineal healing reviewed. Patient expressed understanding - Patient has urinary incontinence? No. - Patient is safe to resume physical and sexual activity  6.  Health Maintenance - HM due items addressed Yes - Last pap smear   Diagnosis  Date Value Ref Range Status  11/02/2022   Final   - Negative for intraepithelial lesion or malignancy (NILM)   Pap smear not done at today's visit.  -Breast Cancer screening indicated? No.   7. Chronic Disease/Pregnancy Condition follow up: None  - PCP follow up   [facility] [employer]

## 2023-06-22 ENCOUNTER — Ambulatory Visit: Payer: Medicaid Other | Admitting: Certified Nurse Midwife

## 2023-07-30 ENCOUNTER — Ambulatory Visit (INDEPENDENT_AMBULATORY_CARE_PROVIDER_SITE_OTHER): Payer: Medicaid Other | Admitting: Obstetrics and Gynecology

## 2023-07-30 ENCOUNTER — Other Ambulatory Visit: Payer: Self-pay

## 2023-07-30 ENCOUNTER — Encounter: Payer: Self-pay | Admitting: Obstetrics and Gynecology

## 2023-07-30 VITALS — BP 132/92 | HR 85 | Wt 145.1 lb

## 2023-07-30 DIAGNOSIS — Z1339 Encounter for screening examination for other mental health and behavioral disorders: Secondary | ICD-10-CM

## 2023-07-30 DIAGNOSIS — N926 Irregular menstruation, unspecified: Secondary | ICD-10-CM

## 2023-07-30 NOTE — Progress Notes (Signed)
   GYNECOLOGY PROGRESS NOTE  History:  33 y.o. [id number] presents to [facility] irregular bleeding since delivery. Had a vaginal delivery 05/17/23. Reports she stopped bleeding after delivery. Then she might spot here and there. She reports having what she believes to be a cycle last week for 7 days and then it went away for 3-4 days and started again for 4 days. Declines heavy bleeding. Denies vaginal or pelvic complaints. Is currently breast and bottle feeding. Not on birth control, does not desire birth control.   The following portions of the patient's history were reviewed and updated as appropriate: allergies, current medications, past family history, past medical history, past social history, past surgical history and problem list. Last pap smear on 11/02/22 was normal, neg HRHPV.  Health Maintenance Due  Topic Date Due   DTaP/Tdap/Td (1 - Tdap) Never done   COVID-19 Vaccine (1 - 2024-25 season) Never done     Review of Systems:  Pertinent items are noted in HPI.   Objective:  Physical Exam Blood pressure (!) 132/92, pulse 85, weight 145 lb 2 oz (65.8 kg), currently breastfeeding. VS reviewed, nursing note reviewed,  Constitutional: well developed, well nourished, no distress HEENT: normocephalic CV: normal rate Pulm/chest wall: normal effort Breast Exam: deferred Abdomen: soft Neuro: alert and oriented  Skin: warm, dry Psych: affect normal Pelvic exam: deferred  Assessment & Plan:  1. Irregular bleeding (Primary) Discussed bleeding after delivery, breast/bottle feeding, differentials, declines swab today, declines BC today. Discussed monitoring cycle, if continues to be irregular encouraged to follow up and see if we need to do further work up or medication   Albertine Grates, FNP

## 2023-08-09 ENCOUNTER — Encounter: Payer: Self-pay | Admitting: Family Medicine

## 2024-04-28 ENCOUNTER — Inpatient Hospital Stay (HOSPITAL_COMMUNITY)
Admission: AD | Admit: 2024-04-28 | Discharge: 2024-04-28 | Disposition: A | Discharge status: Home / Self Care | Attending: Obstetrics and Gynecology | Admitting: Obstetrics and Gynecology

## 2024-04-28 ENCOUNTER — Other Ambulatory Visit: Payer: Self-pay

## 2024-04-28 DIAGNOSIS — Z3200 Encounter for pregnancy test, result unknown: Secondary | ICD-10-CM | POA: Diagnosis not present

## 2024-04-28 NOTE — MAU Note (Signed)
 Becky Strickland is a 33 y.o. at Unknown here in MAU reporting: she wants proof of pregnancy, had +HPT.  Denies VB and current pain.  Has had light cramps, but they don't hurt.   LMP: 03/27/2024 Onset of complaint: today Pain score: 0 Vitals:   04/28/24 1426  BP: 122/75  Pulse: 83  Resp: 18  Temp: 98.2 F (36.8 C)  SpO2: 98%     FHT: NA  Lab orders placed from triage: None

## 2024-04-28 NOTE — MAU Provider Note (Signed)
  S:   32 y.o. [id number] @Unknown  by LMP presents to MAU for pregnancy confirmation.  She denies abdominal pain or vaginal bleeding today.    O: BP 122/75 (BP Location: Right Arm)   Pulse 83   Temp 98.2 F (36.8 C) (Oral)   Resp 18   Ht 5' 2 (1.575 m)   Wt 68.7 kg   LMP 03/27/2024   SpO2 98%   BMI 27.71 kg/m  Physical Examination: General appearance - alert, well appearing, and in no distress, oriented to person, place, and time and acyanotic, in no respiratory distress  No results found for this or any previous visit (from the past 48 hours).  A: Missed menses  P: D/C home Informed pt we do not do pregnancy verifications in MAU Referred to [facility] for pregnancy test & verification Return to MAU as needed for pregnancy related emergencies  Delon Emms, NP 2:55 PM

## 2024-05-02 ENCOUNTER — Ambulatory Visit (INDEPENDENT_AMBULATORY_CARE_PROVIDER_SITE_OTHER)

## 2024-05-02 DIAGNOSIS — Z3201 Encounter for pregnancy test, result positive: Secondary | ICD-10-CM | POA: Diagnosis not present

## 2024-05-02 LAB — POCT PREGNANCY, URINE: Preg Test, Ur: POSITIVE — AB

## 2024-05-02 NOTE — Patient Instructions (Signed)
Safe Medications in Pregnancy  ? ?Acne:  ?Benzoyl Peroxide  ?Salicylic Acid  ? ?Backache/Headache:  ?Tylenol: 2 regular strength every 4 hours OR  ?             2 Extra strength every 6 hours  ? ?Colds/Coughs/Allergies:  ?Benadryl (alcohol free) 25 mg every 6 hours as needed  ?Breath right strips  ?Claritin  ?Cepacol throat lozenges  ?Chloraseptic throat spray  ?Cold-Eeze- up to three times per day  ?Cough drops, alcohol free  ?Flonase (by prescription only)  ?Guaifenesin  ?Mucinex  ?Robitussin DM (plain only, alcohol free)  ?Saline nasal spray/drops  ?Sudafed (pseudoephedrine) & Actifed * use only after [redacted] weeks gestation and if you do not have high blood pressure  ?Tylenol  ?Vicks Vaporub  ?Zinc lozenges  ?Zyrtec  ? ?Constipation:  ?Colace  ?Ducolax suppositories  ?Fleet enema  ?Glycerin suppositories  ?Metamucil  ?Milk of magnesia  ?Miralax  ?Senokot  ?Smooth move tea  ? ?Diarrhea:  ?Kaopectate  ?Imodium A-D  ? ?*NO pepto Bismol  ? ?Hemorrhoids:  ?Anusol  ?Anusol HC  ?Preparation H  ?Tucks  ? ?Indigestion:  ?Tums  ?Maalox  ?Mylanta  ?Zantac  ?Pepcid  ? ?Insomnia:  ?Benadryl (alcohol free) 25mg every 6 hours as needed  ?Tylenol PM  ?Unisom, no Gelcaps  ? ?Leg Cramps:  ?Tums  ?MagGel  ? ?Nausea/Vomiting:  ?Bonine  ?Dramamine  ?Emetrol  ?Ginger extract  ?Sea bands  ?Meclizine  ?Nausea medication to take during pregnancy:  ?Unisom (doxylamine succinate 25 mg tablets) Take one tablet daily at bedtime. If symptoms are not adequately controlled, the dose can be increased to a maximum recommended dose of two tablets daily (1/2 tablet in the morning, 1/2 tablet mid-afternoon and one at bedtime).  ?Vitamin B6 100mg tablets. Take one tablet twice a day (up to 200 mg per day).  ? ?Skin Rashes:  ?Aveeno products  ?Benadryl cream or 25mg every 6 hours as needed  ?Calamine Lotion  ?1% cortisone cream  ? ?Yeast infection:  ?Gyne-lotrimin 7  ?Monistat 7  ? ? ?**If taking multiple medications, please check labels to avoid  duplicating the same active ingredients  ?**take medication as directed on the label  ?** Do not exceed 4000 mg of tylenol in 24 hours  ?**Do not take medications that contain aspirin or ibuprofen  ?    ?   ?  ?[facility] Prenatal Care Providers ?         ?[facility] locations:  ?Hours may vary. Please call for an appointment ? ?[facility] at [facility]             ?[address], [place], [place] [address] ?[phone] ? ?[facility] at [facility]                                                             ?[address], [address], [place], [place], [address] ?[phone] ? ?[facility] at [facility]                                    ?[address], [address], , [place], [address] ?[phone] ? ?[facility] at [place] ?[address], [address], [place], [place], [address] ?[phone] ? ?[facility] at [facility]                                 ?[address], [place], [place], [address] ?[phone] ? ?[facility] at [facility]                                    ?[address], [place], [place], [address] ?[phone] ? ?[facility] at [facility] ?[address], [address], [place], [place], [address]                             ?  ?

## 2024-05-02 NOTE — Progress Notes (Signed)
 Janashia here in office today to drop off urine for pregnancy test. UPT result was positive. I called the patient to notify her of the result. The patient states her LMP: 03/27/24.  EDD 01/01/25  I reviewed the patients allergies, medications, OB history and medical history. I advised the patient begin prenatal care. The patient states she would call the office to schedule an appointment. Patient denies bleeding and pain. I reviewed MAU precautions with the patient and she verbalized understanding. Patient states she needs a pregnancy verification letter for medical insurance. I told the patient that a letter will be sent to her MyChart. Patient states she has no further questions or concerns.   Devon, RN 05/02/24

## 2024-05-05 ENCOUNTER — Encounter: Payer: Self-pay | Admitting: Family Medicine

## 2024-06-06 ENCOUNTER — Telehealth (INDEPENDENT_AMBULATORY_CARE_PROVIDER_SITE_OTHER)

## 2024-06-06 DIAGNOSIS — D563 Thalassemia minor: Secondary | ICD-10-CM

## 2024-06-06 DIAGNOSIS — Z3A10 10 weeks gestation of pregnancy: Secondary | ICD-10-CM

## 2024-06-06 DIAGNOSIS — Z3490 Encounter for supervision of normal pregnancy, unspecified, unspecified trimester: Secondary | ICD-10-CM | POA: Insufficient documentation

## 2024-06-06 DIAGNOSIS — O99111 Other diseases of the blood and blood-forming organs and certain disorders involving the immune mechanism complicating pregnancy, first trimester: Secondary | ICD-10-CM

## 2024-06-06 MED ORDER — GOJJI WEIGHT SCALE MISC: 1.0000 | Freq: Once | 0 refills | Status: AC

## 2024-06-06 MED ORDER — PRENATAL PLUS VITAMIN/MINERAL 27-1 MG PO TABS: 1.0000 | ORAL_TABLET | Freq: Every day | ORAL | 11 refills | Status: AC

## 2024-06-06 NOTE — Progress Notes (Cosign Needed Addendum)
 New OB Intake  I connected with Becky Strickland on 06/06/24 at  8:15 AM EST by MyChart Video Visit and verified that I am speaking with the correct person using two identifiers. Nurse is located at [facility] and pt is located in parked personal vehicle.  I discussed the limitations, risks, security and privacy concerns of performing an evaluation and management service by telephone and the availability of in person appointments. I also discussed with the patient that there may be a patient responsible charge related to this service. The patient expressed understanding and agreed to proceed.  I explained I am completing New OB Intake today. We discussed EDD of 01/01/25 based on LMP 03/27/24. This was her last documented period, she is unsure if she has had a more recent period so dating US  scheduled for tomorrow. Pt is G5P1031. I reviewed her allergies, medications and Medical/Surgical/OB history.    Patient Active Problem List   Diagnosis Date Noted   Supervision of low-risk pregnancy 06/06/2024   Fibroid uterus 12/28/2022   Alpha thalassemia silent carrier 12/01/2022    Concerns addressed today Silent carrier alpha-thalassemia: Father of baby tested during last pregnancy and found to be a carrier for sickle cell disease and to have increased carrier risk for SMA. After discussing these things last pregnancy she felt very scared. Encouraged patient to discuss further with Dr. Eldonna at new OB.  Delivery Plans Plans to deliver at [facility]. Discussed the nature of our practice with multiple providers including residents and students as well as female and female providers. Due to the size of the practice, the delivering provider may not be the same as those providing prenatal care. Patient is interested in water birth. Completed class during last pregnancy but did not have waterbirth.   MyChart/Babyscripts MyChart access verified. Babyscripts instructions given and order placed.   Blood Pressure  Cuff/Weight Scale Has BP cuff. Explained after first prenatal appt pt will check weekly and document in Babyscripts. Patient does not have weight scale; order sent to [facility], patient may track weight weekly in Babyscripts.  Anatomy US  Explained anatomy US  will be around 19 weeks. Message sent to MFM to schedule.  Is patient a CenteringPregnancy candidate?  Declined; Enrolled in [facility]   Is patient a Mom+Baby Combined Care candidate?  Accepted; previously enrolled, has 33 yo  Is patient a candidate for Babyscripts Optimization? Yes, patient declined  First visit review I reviewed new OB appt with patient. Explained pt will be seen by Suzen Eldonna, MD at first visit. Discussed [place] genetic screening with patient. Horizon previously completed. Desires Panorama with routine prenatal labs.  Last Pap Diagnosis  Date Value Ref Range Status  11/02/2022   Final   - Negative for intraepithelial lesion or malignancy (NILM)   Vernell FORBES Ruddle, RN 06/06/2024  9:07 AM

## 2024-06-06 NOTE — Patient Instructions (Signed)
 [facility] [address], [place], [place] [address] [phone] Hours: Sunday Closed Monday 9AM-6PM Tuesday 9AM-6PM Wednesday 9AM-6PM Thursday 9AM-6PM Friday           9AM-6PM Saturday         10 AM-1PM

## 2024-06-07 ENCOUNTER — Ambulatory Visit (INDEPENDENT_AMBULATORY_CARE_PROVIDER_SITE_OTHER)

## 2024-06-07 ENCOUNTER — Other Ambulatory Visit: Payer: Self-pay | Admitting: Family Medicine

## 2024-06-07 ENCOUNTER — Other Ambulatory Visit: Payer: Self-pay

## 2024-06-07 DIAGNOSIS — Z3490 Encounter for supervision of normal pregnancy, unspecified, unspecified trimester: Secondary | ICD-10-CM

## 2024-06-07 DIAGNOSIS — D563 Thalassemia minor: Secondary | ICD-10-CM

## 2024-06-07 DIAGNOSIS — Z3491 Encounter for supervision of normal pregnancy, unspecified, first trimester: Secondary | ICD-10-CM | POA: Diagnosis not present

## 2024-06-07 DIAGNOSIS — Z3A10 10 weeks gestation of pregnancy: Secondary | ICD-10-CM | POA: Diagnosis not present

## 2024-06-14 ENCOUNTER — Other Ambulatory Visit: Payer: Self-pay

## 2024-06-14 ENCOUNTER — Ambulatory Visit (INDEPENDENT_AMBULATORY_CARE_PROVIDER_SITE_OTHER): Admitting: Family Medicine

## 2024-06-14 ENCOUNTER — Other Ambulatory Visit (HOSPITAL_COMMUNITY)
Admission: RE | Admit: 2024-06-14 | Discharge: 2024-06-14 | Disposition: A | Discharge status: Home / Self Care | Source: Ambulatory Visit | Attending: Family Medicine | Admitting: Family Medicine

## 2024-06-14 VITALS — BP 107/74 | HR 79 | Wt 151.0 lb

## 2024-06-14 DIAGNOSIS — Z3491 Encounter for supervision of normal pregnancy, unspecified, first trimester: Secondary | ICD-10-CM

## 2024-06-14 DIAGNOSIS — D563 Thalassemia minor: Secondary | ICD-10-CM

## 2024-06-14 DIAGNOSIS — D251 Intramural leiomyoma of uterus: Secondary | ICD-10-CM | POA: Diagnosis not present

## 2024-06-14 NOTE — Progress Notes (Signed)
 INITIAL PRENATAL VISIT  Subjective:   Becky Strickland is being seen today for her first obstetrical visit.  This is not a planned pregnancy. This is a desired pregnancy.  She is at [redacted]w[redacted]d gestation by LMP Her obstetrical history is significant for None. Relationship with FOB: Involved- Caleb. Patient does intend to breast feed. Pregnancy history fully reviewed.  Patient reports no complaints.  Indications for ASA therapy (per uptodate) One of the following: Previous pregnancy with preeclampsia, especially early onset and with an adverse outcome No Multifetal gestation No Chronic hypertension No Type 1 or 2 diabetes mellitus No Chronic kidney disease No Autoimmune disease (antiphospholipid syndrome, systemic lupus erythematosus) No  Two or more of the following: Nulliparity No Obesity (body mass index >30 kg/m2) No Family history of preeclampsia in mother or sister No Age >=35 years No Sociodemographic characteristics (African American race, low socioeconomic level) Yes Personal risk factors (eg, previous pregnancy with low birth weight or small for gestational age infant, previous adverse pregnancy outcome [eg, stillbirth], interval >10 years between pregnancies) No  Early screening tests: A1C   Review of Systems:   Review of Systems  Objective:    Obstetric History OB History  Gravida Para Term Preterm AB Living  5 1 1  0 3 1  SAB IAB Ectopic Multiple Live Births  3 0 0 0 1    # Outcome Date GA Lbr Len/2nd Weight Sex Type Anes PTL Lv  5 Current           4 SAB 2025          3 Term 05/17/23 [redacted]w[redacted]d 24:24 / 01:29 8 lb 4.3 oz (3.75 kg) M Vag-Spont EPI  LIV  2 SAB 01/02/22 [redacted]w[redacted]d         1 SAB 2021            Past Medical History:  Diagnosis Date   Medical history non-contributory     Past Surgical History:  Procedure Laterality Date   NO PAST SURGERIES      Current Outpatient Medications on File Prior to Visit  Medication Sig Dispense Refill   Prenatal Vit-Fe  Fumarate-FA (PRENATAL PLUS VITAMIN/MINERAL) 27-1 MG TABS Take 1 tablet by mouth daily. 30 tablet 11   No current facility-administered medications on file prior to visit.    No Known Allergies  Social History:  reports that she has never smoked. She has never used smokeless tobacco. She reports that she does not currently use alcohol. She reports that she does not use drugs.  Family History  Problem Relation Age of Onset   Fibroids Mother    Healthy Father    Fibroids Maternal Grandmother     The following portions of the patient's history were reviewed and updated as appropriate: allergies, current medications, past family history, past medical history, past social history, past surgical history and problem list.  Review of Systems Review of Systems  Constitutional:  Negative for chills and fever.  HENT:  Negative for congestion and sore throat.   Eyes:  Negative for pain and visual disturbance.  Respiratory:  Negative for cough, chest tightness and shortness of breath.   Cardiovascular:  Negative for chest pain.  Gastrointestinal:  Negative for abdominal pain, diarrhea, nausea and vomiting.  Endocrine: Negative for cold intolerance and heat intolerance.  Genitourinary:  Negative for dysuria and flank pain.  Musculoskeletal:  Negative for back pain.  Skin:  Negative for rash.  Allergic/Immunologic: Negative for food allergies.  Neurological:  Negative for  dizziness and light-headedness.  Psychiatric/Behavioral:  Negative for agitation.       Physical Exam:  BP 107/74   Pulse 79   Wt 151 lb (68.5 kg)   LMP 03/27/2024 (Exact Date)   BMI 27.62 kg/m  CONSTITUTIONAL: Well-developed, well-nourished female in no acute distress.  HENT:  Normocephalic, atraumatic.  Oropharynx is clear and moist EYES: Conjunctivae normal. No scleral icterus.  NECK: Normal range of motion, supple, no masses.  Normal thyroid.  SKIN: Skin is warm and dry. No rash noted. Not diaphoretic. No  erythema. No pallor. MUSCULOSKELETAL: Normal range of motion. No tenderness.  No cyanosis, clubbing, or edema.   NEUROLOGIC: Alert and oriented to person, place, and time. Normal muscle tone coordination.  PSYCHIATRIC: Normal mood and affect. Normal behavior. Normal judgment and thought content. CARDIOVASCULAR: Normal heart rate noted, regular rhythm RESPIRATORY: Clear to auscultation bilaterally. Effort and breath sounds normal, no problems with respiration noted. BREASTS: Symmetric in size. No masses, skin changes, nipple drainage, or lymphadenopathy. ABDOMEN: Soft, normal bowel sounds, no distention noted.  No tenderness, rebound or guarding. Fundal ht: below pelvic PELVIC: Not indicated  Fetal Heart Rate (bpm): 163   Movement: Absent       Assessment:    Pregnancy: G5P1031  1. Encounter for supervision of low-risk pregnancy in first trimester (Primary) - CBC/D/Plt+RPR+Rh+ABO+RubIgG... - PANORAMA PRENATAL TEST - Culture, OB Urine - HgB A1c - GC/Chlamydia probe amp ([place])not at [facility]  2. Intramural leiomyoma of uterus  3. Alpha thalassemia silent carrier    Plan:     Initial labs drawn. Prenatal vitamins. Problem list reviewed and updated. Reviewed in detail the nature of the practice with collaborative care between  Genetic screening discussed: NIPS ordered. Role of ultrasound in pregnancy discussed; Anatomy US : requested. Amniocentesis discussed: not indicated. Follow up in 4 weeks. Weight gain recommendations per IOM guidelines reviewed: underweight/BMI 18.5 or less > 28 - 40 lbs; normal weight/BMI 18.5 - 24.9 > 25 - 35 lbs; overweight/BMI 25 - 29.9 > 15 - 25 lbs; obese/BMI  30 or more > 11 - 20 lbs.  Discussed clinic routines, schedule of care and testing, genetic screening options, involvement of students and residents under the direct supervision of APPs and doctors and presence of female providers. Pt verbalized understanding.  Future Appointments  Date Time  Provider Department Center  08/11/2024 10:00 AM [facility] PROVIDER 1 [facility]  08/11/2024 10:30 AM [facility] US1 [facility]     Eldonna Suzen Octave, MD 06/14/2024 3:12 PM

## 2024-06-15 ENCOUNTER — Encounter: Payer: Self-pay | Admitting: Family Medicine

## 2024-06-15 LAB — CBC/D/PLT+RPR+RH+ABO+RUBIGG...
Antibody Screen: NEGATIVE
Basophils Absolute: 0 x10E3/uL (ref 0.0–0.2)
Basos: 1 %
EOS (ABSOLUTE): 0.2 x10E3/uL (ref 0.0–0.4)
Eos: 3 %
HCV Ab: NONREACTIVE
HIV Screen 4th Generation wRfx: NONREACTIVE
Hematocrit: 37.8 % (ref 34.0–46.6)
Hemoglobin: 12.1 g/dL (ref 11.1–15.9)
Hepatitis B Surface Ag: NEGATIVE
Immature Grans (Abs): 0 x10E3/uL (ref 0.0–0.1)
Immature Granulocytes: 0 %
Lymphocytes Absolute: 1.4 x10E3/uL (ref 0.7–3.1)
Lymphs: 29 %
MCH: 27.4 pg (ref 26.6–33.0)
MCHC: 32 g/dL (ref 31.5–35.7)
MCV: 86 fL (ref 79–97)
Monocytes Absolute: 0.3 x10E3/uL (ref 0.1–0.9)
Monocytes: 7 %
Neutrophils Absolute: 2.8 x10E3/uL (ref 1.4–7.0)
Neutrophils: 60 %
Platelets: 306 x10E3/uL (ref 150–450)
RBC: 4.42 x10E6/uL (ref 3.77–5.28)
RDW: 13.3 % (ref 11.7–15.4)
RPR Ser Ql: NONREACTIVE
Rh Factor: POSITIVE
Rubella Antibodies, IGG: 1.15 {index} (ref >0.99)
WBC: 4.7 x10E3/uL (ref 3.4–10.8)

## 2024-06-15 LAB — GC/CHLAMYDIA PROBE AMP (~~LOC~~) NOT AT ARMC
Chlamydia: NEGATIVE
Comment: NEGATIVE
Comment: NORMAL
Neisseria Gonorrhea: NEGATIVE

## 2024-06-15 LAB — HEMOGLOBIN A1C
Est. average glucose Bld gHb Est-mCnc: 108 mg/dL
Hgb A1c MFr Bld: 5.4 % (ref 4.8–5.6)

## 2024-06-15 LAB — HCV INTERPRETATION

## 2024-06-17 LAB — URINE CULTURE, OB REFLEX

## 2024-06-17 LAB — CULTURE, OB URINE

## 2024-06-25 LAB — PANORAMA PRENATAL TEST FULL PANEL:PANORAMA TEST PLUS 5 ADDITIONAL MICRODELETIONS: FETAL FRACTION: 4.1

## 2024-06-27 ENCOUNTER — Ambulatory Visit: Payer: Self-pay | Admitting: Family Medicine

## 2024-07-03 ENCOUNTER — Encounter: Payer: Self-pay | Admitting: General Practice

## 2024-07-11 NOTE — Progress Notes (Deleted)
 "  PRENATAL VISIT NOTE  Subjective:  Becky Strickland is a 33 y.o. G5P1031 at [redacted]w[redacted]d being seen today for ongoing prenatal care.  She is currently monitored for the following issues for this low-risk pregnancy and has Alpha thalassemia silent carrier; Fibroid uterus; and Supervision of low-risk pregnancy on their problem list.  Patient reports no complaints.   .  .   . Denies leaking of fluid.   The following portions of the patient's history were reviewed and updated as appropriate: allergies, current medications, past family history, past medical history, past social history, past surgical history and problem list.   Objective:   There were no vitals filed for this visit.  Fetal Status:           General: Alert, oriented and cooperative. Patient is in no acute distress.  Skin: Skin is warm and dry. No rash noted.   Cardiovascular: Normal heart rate noted  Respiratory: Normal respiratory effort, no problems with respiration noted  Abdomen: Soft, gravid, appropriate for gestational age.        Pelvic: Cervical exam deferred        Extremities: Normal range of motion.     Mental Status: Normal mood and affect. Normal behavior. Normal judgment and thought content.      06/15/2024    9:07 AM 07/30/2023    9:12 AM 04/19/2023    9:31 AM  Depression screen PHQ 2/9  Decreased Interest 0 0 0  Down, Depressed, Hopeless 0 0 0  PHQ - 2 Score 0 0 0  Altered sleeping 0 1 0  Tired, decreased energy 2 1 1   Change in appetite 0 0 0  Feeling bad or failure about yourself  0 0 0  Trouble concentrating 0 0 0  Moving slowly or fidgety/restless 0 0 0  Suicidal thoughts 0 0 0  PHQ-9 Score 2 2  1    Difficult doing work/chores Not difficult at all       Data saved with a previous flowsheet row definition        06/15/2024    9:08 AM 07/30/2023    9:12 AM 04/19/2023    9:31 AM 11/25/2022   11:59 AM  GAD 7 : Generalized Anxiety Score  Nervous, Anxious, on Edge 0 0 0 0  Control/stop worrying 0 0 0  0  Worry too much - different things 0 0 0 0  Trouble relaxing 0 1 0 0  Restless 0 0 0 0  Easily annoyed or irritable 0 0 0 2  Afraid - awful might happen 0 0 0 0  Total GAD 7 Score 0 1 0 2  Anxiety Difficulty Not difficult at all       Assessment and Plan:  Pregnancy: G5P1031 at [redacted]w[redacted]d 1. Encounter for supervision of low-risk pregnancy in second trimester (Primary) ***  2. Intramural leiomyoma of uterus ***  3. Alpha thalassemia silent carrier ***  Preterm labor symptoms and general obstetric precautions including but not limited to vaginal bleeding, contractions, leaking of fluid and fetal movement were reviewed in detail with the patient. Please refer to After Visit Summary for other counseling recommendations.   No follow-ups on file.  Future Appointments  Date Time Provider Department Center  07/12/2024  3:15 PM Eldonna Suzen Octave, MD [facility]  08/11/2024  8:15 AM Ilean Norleen GAILS, MD [facility]  08/11/2024 10:00 AM [facility] PROVIDER 1 [facility]  08/11/2024 10:30 AM [facility] US1 [facility]    Suzen Octave Eldonna, MD "

## 2024-07-12 ENCOUNTER — Encounter: Admitting: Family Medicine

## 2024-07-12 DIAGNOSIS — Z3492 Encounter for supervision of normal pregnancy, unspecified, second trimester: Secondary | ICD-10-CM

## 2024-07-12 DIAGNOSIS — D563 Thalassemia minor: Secondary | ICD-10-CM

## 2024-07-12 DIAGNOSIS — D251 Intramural leiomyoma of uterus: Secondary | ICD-10-CM

## 2024-07-19 ENCOUNTER — Other Ambulatory Visit: Payer: Self-pay

## 2024-07-19 ENCOUNTER — Encounter: Payer: Self-pay | Admitting: Family Medicine

## 2024-07-19 ENCOUNTER — Ambulatory Visit (INDEPENDENT_AMBULATORY_CARE_PROVIDER_SITE_OTHER): Admitting: Family Medicine

## 2024-07-19 VITALS — BP 100/71 | HR 77 | Wt 150.0 lb

## 2024-07-19 DIAGNOSIS — Z3492 Encounter for supervision of normal pregnancy, unspecified, second trimester: Secondary | ICD-10-CM

## 2024-07-19 DIAGNOSIS — D563 Thalassemia minor: Secondary | ICD-10-CM

## 2024-07-19 DIAGNOSIS — D251 Intramural leiomyoma of uterus: Secondary | ICD-10-CM

## 2024-07-19 DIAGNOSIS — Z3A16 16 weeks gestation of pregnancy: Secondary | ICD-10-CM

## 2024-07-19 NOTE — Progress Notes (Signed)
 "  PRENATAL VISIT NOTE  Subjective:  Becky Strickland is a 34 y.o. G5P1031 at [redacted]w[redacted]d being seen today for ongoing prenatal care.  She is currently monitored for the following issues for this high-risk pregnancy and has Alpha thalassemia silent carrier; Fibroid uterus; and Supervision of low-risk pregnancy on their problem list.  Patient reports no complaints.  Contractions: Irritability. Vag. Bleeding: None.  Movement: Absent. Denies leaking of fluid.   The following portions of the patient's history were reviewed and updated as appropriate: allergies, current medications, past family history, past medical history, past social history, past surgical history and problem list.   Objective:   Vitals:   07/19/24 1505  BP: 100/71  Pulse: 77  Weight: 150 lb (68 kg)    Fetal Status:  Fetal Heart Rate (bpm): 155 Fundal Height: 16 cm Movement: Absent    General: Alert, oriented and cooperative. Patient is in no acute distress.  Skin: Skin is warm and dry. No rash noted.   Cardiovascular: Normal heart rate noted  Respiratory: Normal respiratory effort, no problems with respiration noted  Abdomen: Soft, gravid, appropriate for gestational age.  Pain/Pressure: Absent     Pelvic: Cervical exam deferred        Extremities: Normal range of motion.  Edema: None  Mental Status: Normal mood and affect. Normal behavior. Normal judgment and thought content.      06/15/2024    9:07 AM 07/30/2023    9:12 AM 04/19/2023    9:31 AM  Depression screen PHQ 2/9  Decreased Interest 0 0 0  Down, Depressed, Hopeless 0 0 0  PHQ - 2 Score 0 0 0  Altered sleeping 0 1 0  Tired, decreased energy 2 1 1   Change in appetite 0 0 0  Feeling bad or failure about yourself  0 0 0  Trouble concentrating 0 0 0  Moving slowly or fidgety/restless 0 0 0  Suicidal thoughts 0 0 0  PHQ-9 Score 2 2  1    Difficult doing work/chores Not difficult at all       Data saved with a previous flowsheet row definition         06/15/2024    9:08 AM 07/30/2023    9:12 AM 04/19/2023    9:31 AM 11/25/2022   11:59 AM  GAD 7 : Generalized Anxiety Score  Nervous, Anxious, on Edge 0 0 0 0  Control/stop worrying 0 0 0 0  Worry too much - different things 0 0 0 0  Trouble relaxing 0 1 0 0  Restless 0 0 0 0  Easily annoyed or irritable 0 0 0 2  Afraid - awful might happen 0 0 0 0  Total GAD 7 Score 0 1 0 2  Anxiety Difficulty Not difficult at all       Assessment and Plan:  Pregnancy: G5P1031 at [redacted]w[redacted]d 1. [redacted] weeks gestation of pregnancy  2. Encounter for supervision of low-risk pregnancy in second trimester (Primary) Up to date Vigorous movement Concerns: none, is frustrated with copays for prenatal.   3. Alpha thalassemia silent carrier FOB neg  4. Intramural leiomyoma of uterus   Preterm labor symptoms and general obstetric precautions including but not limited to vaginal bleeding, contractions, leaking of fluid and fetal movement were reviewed in detail with the patient. Please refer to After Visit Summary for other counseling recommendations.   Return in about 4 weeks (around 08/16/2024) for Mom+Baby Combined Care.  Future Appointments  Date Time Provider Department Center  08/11/2024  8:15 AM Ilean Norleen GAILS, MD [facility]  08/11/2024 10:00 AM [facility] PROVIDER 1 [facility]  08/11/2024 10:30 AM [facility] US1 [facility]    Suzen Maryan Masters, MD "

## 2024-08-11 ENCOUNTER — Ambulatory Visit

## 2024-08-11 ENCOUNTER — Other Ambulatory Visit: Payer: Self-pay

## 2024-08-11 ENCOUNTER — Ambulatory Visit: Admitting: Family Medicine

## 2024-08-11 ENCOUNTER — Ambulatory Visit: Attending: Obstetrics and Gynecology | Admitting: Obstetrics

## 2024-08-11 VITALS — BP 106/66 | HR 75

## 2024-08-11 VITALS — BP 107/74 | HR 101 | Wt 149.7 lb

## 2024-08-11 DIAGNOSIS — Z3A19 19 weeks gestation of pregnancy: Secondary | ICD-10-CM

## 2024-08-11 DIAGNOSIS — D251 Intramural leiomyoma of uterus: Secondary | ICD-10-CM

## 2024-08-11 DIAGNOSIS — D563 Thalassemia minor: Secondary | ICD-10-CM

## 2024-08-11 DIAGNOSIS — Z3492 Encounter for supervision of normal pregnancy, unspecified, second trimester: Secondary | ICD-10-CM

## 2024-08-11 DIAGNOSIS — Z3490 Encounter for supervision of normal pregnancy, unspecified, unspecified trimester: Secondary | ICD-10-CM | POA: Diagnosis not present

## 2024-08-11 DIAGNOSIS — R000 Tachycardia, unspecified: Secondary | ICD-10-CM

## 2024-08-11 DIAGNOSIS — Z363 Encounter for antenatal screening for malformations: Secondary | ICD-10-CM | POA: Diagnosis present

## 2024-08-11 DIAGNOSIS — O09892 Supervision of other high risk pregnancies, second trimester: Secondary | ICD-10-CM | POA: Diagnosis not present

## 2024-08-11 DIAGNOSIS — Z3689 Encounter for other specified antenatal screening: Secondary | ICD-10-CM

## 2024-08-11 DIAGNOSIS — Z148 Genetic carrier of other disease: Secondary | ICD-10-CM | POA: Insufficient documentation

## 2024-08-11 DIAGNOSIS — R002 Palpitations: Secondary | ICD-10-CM

## 2024-08-11 DIAGNOSIS — O99012 Anemia complicating pregnancy, second trimester: Secondary | ICD-10-CM | POA: Diagnosis not present

## 2024-08-11 DIAGNOSIS — O35BXX0 Maternal care for other (suspected) fetal abnormality and damage, fetal cardiac anomalies, not applicable or unspecified: Secondary | ICD-10-CM | POA: Diagnosis not present

## 2024-08-11 DIAGNOSIS — O2622 Pregnancy care for patient with recurrent pregnancy loss, second trimester: Secondary | ICD-10-CM | POA: Insufficient documentation

## 2024-08-11 DIAGNOSIS — O35EXX0 Maternal care for other (suspected) fetal abnormality and damage, fetal genitourinary anomalies, not applicable or unspecified: Secondary | ICD-10-CM

## 2024-08-11 NOTE — Progress Notes (Signed)
 "  PRENATAL VISIT NOTE  Subjective:  Becky Strickland is a 34 y.o. G5P1031 at [redacted]w[redacted]d being seen today for ongoing prenatal care.  She is currently monitored for the following issues for this low-risk pregnancy and has Alpha thalassemia silent carrier; Fibroid uterus; and Supervision of low-risk pregnancy on their problem list.  Patient reports no bleeding, no contractions, no cramping, and no leaking.  Contractions: Not present. Vag. Bleeding: None.  Movement: Present. Denies leaking of fluid.   The following portions of the patient's history were reviewed and updated as appropriate: allergies, current medications, past family history, past medical history, past social history, past surgical history and problem list.   Objective:   Vitals:   08/11/24 0825  BP: 107/74  Pulse: (!) 101  Weight: 149 lb 11.2 oz (67.9 kg)    Fetal Status:  Fetal Heart Rate (bpm): 153   Movement: Present    General: Alert, oriented and cooperative. Patient is in no acute distress.  Skin: Skin is warm and dry. No rash noted.   Cardiovascular: Normal heart rate noted  Respiratory: Normal respiratory effort, no problems with respiration noted  Abdomen: Soft, gravid, appropriate for gestational age.  Pain/Pressure: Present (pressure)     Pelvic: Cervical exam deferred        Extremities: Normal range of motion.  Edema: None  Mental Status: Normal mood and affect. Normal behavior. Normal judgment and thought content.      06/15/2024    9:07 AM 07/30/2023    9:12 AM 04/19/2023    9:31 AM  Depression screen PHQ 2/9  Decreased Interest 0 0 0  Down, Depressed, Hopeless 0 0 0  PHQ - 2 Score 0 0 0  Altered sleeping 0 1 0  Tired, decreased energy 2 1 1   Change in appetite 0 0 0  Feeling bad or failure about yourself  0 0 0  Trouble concentrating 0 0 0  Moving slowly or fidgety/restless 0 0 0  Suicidal thoughts 0 0 0  PHQ-9 Score 2 2  1    Difficult doing work/chores Not difficult at all       Data saved with a  previous flowsheet row definition        06/15/2024    9:08 AM 07/30/2023    9:12 AM 04/19/2023    9:31 AM 11/25/2022   11:59 AM  GAD 7 : Generalized Anxiety Score  Nervous, Anxious, on Edge 0  0  0  0   Control/stop worrying 0  0  0  0   Worry too much - different things 0  0  0  0   Trouble relaxing 0  1  0  0   Restless 0  0  0  0   Easily annoyed or irritable 0  0  0  2   Afraid - awful might happen 0  0  0  0   Total GAD 7 Score 0 1 0 2  Anxiety Difficulty Not difficult at all        Data saved with a previous flowsheet row definition    Assessment and Plan:  Pregnancy: G5P1031 at [redacted]w[redacted]d 1. Encounter for supervision of low-risk pregnancy in second trimester (Primary) FHR BP appropriate today - AFP, Serum, Open Spina Bifida - CBC - TSH Rfx on Abnormal to Free T4  2. Intramural leiomyoma of uterus  3. Alpha thalassemia silent carrier Partner negative  4. Heart palpitations Recurrent episodes of heart palpitations with lightheadedness.  Reports that  it will happen while she is walking around the store or exerting yourself.  Discussed increasing p.o. hydration and will place referral for cardio OB.  Also checking CBC and TSH. - AMB Referral to Cardio Obstetrics - CBC - TSH Rfx on Abnormal to Free T4  5. Tachycardia - AMB Referral to Cardio Obstetrics - CBC - TSH Rfx on Abnormal to Free T4  6. [redacted] weeks gestation of pregnancy  Preterm labor symptoms and general obstetric precautions including but not limited to vaginal bleeding, contractions, leaking of fluid and fetal movement were reviewed in detail with the patient. Please refer to After Visit Summary for other counseling recommendations.   No follow-ups on file.  Future Appointments  Date Time Provider Department Center  08/11/2024 10:00 AM [facility] PROVIDER 1 [facility]  08/11/2024 10:30 AM [facility] US1 [facility]  09/08/2024  8:15 AM Ilean Norleen GAILS, MD [facility]  10/06/2024 10:35 AM [facility] MOM BABY DYAD  PROVIDER [facility]    Norleen GAILS Ilean, MD  "

## 2024-08-12 NOTE — Progress Notes (Signed)
 MFM Consult Note  Becky Strickland is currently at [redacted]w[redacted]d. She was seen today for a detailed fetal anatomy scan due to a short interval pregnancy.  Her [employer] screening test indicated that she is a silent carrier for alpha thalassemia.    Her partner, Worth Louder who was screened and was found not to be a carrier for alpha thalassemia.  He is a carrier for sickle cell disease and is at increased carrier risk for spinal muscular atrophy.  She denies any significant past medical history and denies any problems in her current pregnancy.    She had a cell free DNA test earlier in her pregnancy which indicated a low risk for trisomy 35, 70, and 13. A female fetus is predicted.   Sonographic findings Single intrauterine pregnancy at 19w 4d. Fetal cardiac activity:  Observed and appears normal. Presentation: Breech. An echogenic focus was noted in the left ventricle of the fetal heart.  Mild left pyelectasis measuring 0.5 cm dilated was also noted. Fetal biometry shows the estimated fetal weight of 0 lb 11 oz,  319 grams (64%). Amniotic fluid: Within normal limits.  MVP: 5.23 cm. Placenta: Posterior. Adnexa: No abnormality visualized. Cervical length: 3.2 cm.  The patient was informed that anomalies may be missed due to technical limitations. If the fetus is in a suboptimal position or maternal habitus is increased, visualization of the fetus in the maternal uterus may be impaired.  Echogenic Focus   The small association between an echogenic focus and Down syndrome was discussed.   Due to the echogenic focus noted today, the patient was offered and declined an amniocentesis today for definitive diagnosis of fetal aneuploidy.    She reports that she is comfortable with the low risk indicated by her cell free DNA test.  Left Pyelectasis  Left pyelectasis measuring 0.5 cm dilated was noted on today's ultrasound exam.    She was advised that the pyelectasis noted on prenatal ultrasounds will  often resolve spontaneously after birth or later in her pregnancy.    We will continue to follow her closely to assess this finding.    The small association between pyelectasis and Down syndrome was discussed.    Silent carrier for alpha thalassemia  She was reassured that as her partner is not a carrier for alpha thalassemia, their child should not be at risk for inheriting this condition.    As she is not a carrier for either sickle cell disease or spinal muscular atrophy, their child should not be at risk for either of these two conditions.    A follow-up exam was scheduled in 5 weeks to reassess the views of the fetal anatomy and to assess the fetal growth.  The patient stated that all of her questions were answered.   A total of 40 minutes was spent counseling and coordinating the care for this patient.  Greater than 50% of the time was spent in direct face-to-face contact.

## 2024-08-14 LAB — AFP, SERUM, OPEN SPINA BIFIDA
AFP MoM: 0.9
AFP Value: 48.7 ng/mL
Gest. Age on Collection Date: 19 wk
Maternal Age At EDD: 31.5 a
OSBR Risk 1 IN: 10000
Test Results:: NEGATIVE
Weight: 149 [lb_av]

## 2024-08-14 LAB — T4F: T4,Free (Direct): 0.81 ng/dL — ABNORMAL LOW (ref 0.82–1.77)

## 2024-08-14 LAB — CBC
Hematocrit: 37.3 % (ref 34.0–46.6)
Hemoglobin: 12.1 g/dL (ref 11.1–15.9)
MCH: 28.3 pg (ref 26.6–33.0)
MCHC: 32.4 g/dL (ref 31.5–35.7)
MCV: 87 fL (ref 79–97)
Platelets: 299 10*3/uL (ref 150–450)
RBC: 4.28 x10E6/uL (ref 3.77–5.28)
RDW: 13.9 % (ref 11.7–15.4)
WBC: 6.5 10*3/uL (ref 3.4–10.8)

## 2024-08-14 LAB — TSH RFX ON ABNORMAL TO FREE T4: TSH: 6.65 u[IU]/mL — ABNORMAL HIGH (ref 0.450–4.500)

## 2024-08-16 ENCOUNTER — Ambulatory Visit: Payer: Self-pay | Admitting: Family Medicine

## 2024-08-16 DIAGNOSIS — E039 Hypothyroidism, unspecified: Secondary | ICD-10-CM | POA: Insufficient documentation

## 2024-08-16 MED ORDER — LEVOTHYROXINE SODIUM 100 MCG PO TABS: 100.0000 ug | ORAL_TABLET | Freq: Every day | ORAL | 0 refills | Status: AC

## 2024-09-08 ENCOUNTER — Encounter: Admitting: Family Medicine

## 2024-09-22 ENCOUNTER — Ambulatory Visit: Admitting: Cardiology

## 2024-10-06 ENCOUNTER — Encounter: Admitting: Family Medicine
